# Patient Record
Sex: Female | Born: 1965
Health system: Southern US, Community
[De-identification: ages and names within clinical notes are randomized; demographics above are authoritative.]

## PROBLEM LIST (undated history)

## (undated) DIAGNOSIS — Z8489 Family history of other specified conditions: Secondary | ICD-10-CM

## (undated) DIAGNOSIS — K219 Gastro-esophageal reflux disease without esophagitis: Secondary | ICD-10-CM

## (undated) DIAGNOSIS — M1990 Unspecified osteoarthritis, unspecified site: Secondary | ICD-10-CM

---

## 1999-07-29 ENCOUNTER — Other Ambulatory Visit: Admission: RE | Admit: 1999-07-29 | Discharge: 1999-07-29 | Payer: Self-pay | Admitting: Obstetrics and Gynecology

## 2000-03-22 ENCOUNTER — Encounter: Payer: Self-pay | Admitting: Obstetrics & Gynecology

## 2000-03-22 ENCOUNTER — Encounter: Admission: RE | Admit: 2000-03-22 | Discharge: 2000-03-22 | Payer: Self-pay | Admitting: Obstetrics & Gynecology

## 2000-04-19 ENCOUNTER — Encounter: Payer: Self-pay | Admitting: Obstetrics & Gynecology

## 2000-04-19 ENCOUNTER — Encounter: Admission: RE | Admit: 2000-04-19 | Discharge: 2000-04-19 | Payer: Self-pay | Admitting: Obstetrics & Gynecology

## 2000-08-07 ENCOUNTER — Other Ambulatory Visit: Admission: RE | Admit: 2000-08-07 | Discharge: 2000-08-07 | Payer: Self-pay | Admitting: *Deleted

## 2000-09-15 ENCOUNTER — Ambulatory Visit (HOSPITAL_COMMUNITY): Admission: RE | Admit: 2000-09-15 | Discharge: 2000-09-15 | Payer: Self-pay | Admitting: Obstetrics & Gynecology

## 2000-09-15 ENCOUNTER — Encounter: Payer: Self-pay | Admitting: Obstetrics & Gynecology

## 2000-09-19 ENCOUNTER — Ambulatory Visit (HOSPITAL_COMMUNITY): Admission: RE | Admit: 2000-09-19 | Discharge: 2000-09-19 | Payer: Self-pay | Admitting: Obstetrics & Gynecology

## 2000-09-19 ENCOUNTER — Encounter: Payer: Self-pay | Admitting: Obstetrics & Gynecology

## 2001-08-22 ENCOUNTER — Other Ambulatory Visit: Admission: RE | Admit: 2001-08-22 | Discharge: 2001-08-22 | Payer: Self-pay | Admitting: Obstetrics & Gynecology

## 2002-08-08 ENCOUNTER — Other Ambulatory Visit: Admission: RE | Admit: 2002-08-08 | Discharge: 2002-08-08 | Payer: Self-pay | Admitting: Obstetrics & Gynecology

## 2003-03-09 ENCOUNTER — Inpatient Hospital Stay (HOSPITAL_COMMUNITY): Admission: AD | Admit: 2003-03-09 | Discharge: 2003-03-11 | Payer: Self-pay | Admitting: Obstetrics & Gynecology

## 2003-03-18 ENCOUNTER — Encounter: Admission: RE | Admit: 2003-03-18 | Discharge: 2003-04-17 | Payer: Self-pay | Admitting: Obstetrics & Gynecology

## 2003-04-18 ENCOUNTER — Other Ambulatory Visit: Admission: RE | Admit: 2003-04-18 | Discharge: 2003-04-18 | Payer: Self-pay | Admitting: Obstetrics & Gynecology

## 2003-04-18 ENCOUNTER — Encounter: Admission: RE | Admit: 2003-04-18 | Discharge: 2003-05-18 | Payer: Self-pay | Admitting: Obstetrics & Gynecology

## 2003-06-18 ENCOUNTER — Encounter: Admission: RE | Admit: 2003-06-18 | Discharge: 2003-07-18 | Payer: Self-pay | Admitting: Obstetrics & Gynecology

## 2003-07-19 ENCOUNTER — Encounter: Admission: RE | Admit: 2003-07-19 | Discharge: 2003-08-18 | Payer: Self-pay | Admitting: Obstetrics & Gynecology

## 2003-09-18 ENCOUNTER — Encounter: Admission: RE | Admit: 2003-09-18 | Discharge: 2003-10-18 | Payer: Self-pay | Admitting: Obstetrics & Gynecology

## 2004-04-29 ENCOUNTER — Encounter: Admission: RE | Admit: 2004-04-29 | Discharge: 2004-07-01 | Payer: Self-pay | Admitting: Neurosurgery

## 2004-07-26 ENCOUNTER — Encounter: Admission: RE | Admit: 2004-07-26 | Discharge: 2004-07-26 | Payer: Self-pay | Admitting: Occupational Medicine

## 2005-06-19 ENCOUNTER — Emergency Department (HOSPITAL_COMMUNITY): Admission: EM | Admit: 2005-06-19 | Discharge: 2005-06-19 | Payer: Self-pay | Admitting: Family Medicine

## 2005-07-08 ENCOUNTER — Ambulatory Visit (HOSPITAL_COMMUNITY): Admission: RE | Admit: 2005-07-08 | Discharge: 2005-07-08 | Payer: Self-pay | Admitting: Interventional Cardiology

## 2006-10-18 ENCOUNTER — Encounter (INDEPENDENT_AMBULATORY_CARE_PROVIDER_SITE_OTHER): Payer: Self-pay | Admitting: *Deleted

## 2006-10-18 ENCOUNTER — Ambulatory Visit (HOSPITAL_COMMUNITY): Admission: RE | Admit: 2006-10-18 | Discharge: 2006-10-18 | Payer: Self-pay | Admitting: Obstetrics & Gynecology

## 2007-03-13 ENCOUNTER — Encounter: Admission: RE | Admit: 2007-03-13 | Discharge: 2007-05-24 | Payer: Self-pay | Admitting: Orthopaedic Surgery

## 2007-08-30 ENCOUNTER — Ambulatory Visit (HOSPITAL_COMMUNITY): Admission: RE | Admit: 2007-08-30 | Discharge: 2007-08-30 | Payer: Self-pay | Admitting: Interventional Cardiology

## 2009-07-23 ENCOUNTER — Encounter: Admission: RE | Admit: 2009-07-23 | Discharge: 2009-07-23 | Payer: Self-pay | Admitting: Obstetrics & Gynecology

## 2010-08-05 ENCOUNTER — Encounter: Admission: RE | Admit: 2010-08-05 | Discharge: 2010-08-05 | Payer: Self-pay | Admitting: Obstetrics & Gynecology

## 2010-11-21 ENCOUNTER — Encounter: Payer: Self-pay | Admitting: Neurosurgery

## 2010-11-21 ENCOUNTER — Encounter: Payer: Self-pay | Admitting: Obstetrics & Gynecology

## 2011-03-18 NOTE — H&P (Signed)
NAMEAMARISA, [name]                           ACCOUNT NO.:  [id number]   MEDICAL RECORD NO.:  [id number]                   PATIENT TYPE:  INP   LOCATION:  9162                                 FACILITY:  [facility]   PHYSICIAN:  [name], M.D.             DATE OF BIRTH:  08/07/1966   DATE OF ADMISSION:  03/09/2003  DATE OF DISCHARGE:                                HISTORY & PHYSICAL   HISTORY OF PRESENT ILLNESS:  The patient is a 45 year old G1, expected date  of delivery Mar 18, 2003, at 38 weeks and five days' gestation.   REASON FOR ADMISSION:  Regular uterine contractions with fluid leak times  this morning.   HISTORY OF PRESENT ILLNESS:  Increasing uterine contractions q.64m. since  about 10 a.m. with mild amounts of clear amniotic fluid at that time.  Good  fetal movements.  No vaginal bleeding.  No PIH symptoms.   PAST MEDICAL HISTORY:  Mild arthritis, post track runner in high school.   PAST SURGICAL HISTORY:  Negative.   PAST GYNECOLOGIC HISTORY:  1. Positive for uterine myoma.  2. 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   ALLERGIES:  1. COMPAZINE.  2. FLEXERIL.  3. REGLAN, associated with severe edema.   MEDICATIONS:  1. Prenatal vitamins.  2. Unasyn h.s. p.r.n.  3. Darvocet-N 100 p.r.n. for fibroid pain.   FAMILY HISTORY:  Positive for cardiovascular disease.   HISTORY OF PRESENT PREGNANCY:  First trimester was normal.  The patient had  an ultrasound at 10+ weeks for larger than gestational age.  Dating  corresponded well.  The discrepancy was associated with uterine myoma.  One  was 6.1 x 5.8.  The other one was 11.2 x 8.2 cm with central necrosis.  The  patient used Unasyn h.s. and Vitamin B6 for nausea and vomiting in the first  trimester.  In the second trimester, an amniocentesis was done at 15 weeks  for advanced maternal age.  It showed normal chromosomes and __________  protein in the amniotic fluid.  An ultrasound review of  anatomy at 19+ weeks  was within normal limits.  The placenta was anterior previa.  Amniotic fluid  normal.  Cervix 5.7 cm and closed.  In the third trimester, one hour glucose  tolerance test was normal at 98.  A repeat ultrasound was done at 29 weeks,  showing normal placental insertion anteriorly.  The patient did have  progressive abdominal pains associated with anterior myoma, and that was  controlled with Darvocet p.r.n.  At 35+ weeks group B Strep was positive.  Her blood pressure has remained normal throughout the pregnancy and interval  growths with ultrasound were appropriate for gestational age.   REVIEW OF SYSTEMS:  CONSTITUTIONAL:  Negative.  HEENT:  Negative.  CARDIOVASCULAR/RESPIRATORY:  Negative.  GASTROINTESTINAL/UROLOGIC:  Negative.  ENDOCRINE/NEUROLOGIC/DERMATOLOGIC:  Negative.   PHYSICAL EXAMINATION:  GENERAL:  No apparent  distress except for pain with  uterine contractions.  VITAL SIGNS:  Temperature 98.6, blood pressure 126/63, pulse 54-60, regular,  respiratory rate 14.  LUNGS:  Clear bilaterally.  HEART:  Regular cardiac rhythm.  ABDOMEN:  Gravid, cephalic presentation.  Anterior fibroid is palpable,  slightly to the left inferiorly.  PELVIC:  Vaginal exam on admission, 3 cm dilated, 100% effaced, vertex -2  fixed, with tinted meconium amniotic fluid.  EXTREMITIES:  Lower limbs normal.  Monitoring fetal heart rate 135-140s with positive variability, occasional  mild variable decelerations.  Uterine contractions every 3-4 minutes, 70  seconds to 100 seconds.   IMPRESSION:  G1, 38 weeks and five days' gestation with spontaneous labor  and spontaneous rupture of membranes with meconium, fetal well-being  reassuring, group B Strep positive, anterior uterine myoma.   PLAN:  1. Admit to labor and delivery.  Expected management for probable vaginal     delivery.  2. Penicillin G for group B Strep positive.  3. Monitoring.  4. Will start amnio infusion if  meconium becomes moderate to fixed.                                               [name], M.D.    ML/MEDQ  D:  03/09/2003  T:  03/09/2003  Job:  [id number]

## 2011-03-18 NOTE — Op Note (Signed)
NAMEARANZA, [name]               ACCOUNT NO.:  [id number]   MEDICAL RECORD NO.:  [id number]          PATIENT TYPE:  AMB   LOCATION:  SDC                           FACILITY:  [facility]   PHYSICIAN:  Genia Del, M.D.DATE OF BIRTH:  Oct 13, 1966   DATE OF PROCEDURE:  10/18/2006  DATE OF DISCHARGE:                               OPERATIVE REPORT   PREOPERATIVE DIAGNOSES:  Nine plus weeks, non-desired, and severe  degenerative disk disease.   POSTOPERATIVE DIAGNOSES:  Nine plus weeks, non-desired, and severe  degenerative disk disease.   INTERVENTION:  Dilatation and curettage.   SURGEON:  Genia Del, M.D.   ASSISTANT:  None.   DESCRIPTION OF PROCEDURE:  Under general anesthesia with endotracheal  intubation, with the patient in the lithotomy position, she is prepped  with Betadine in the suprapubic, vulvar and vaginal areas.  The bladder  is catheterized and the patient is draped as usual.  The vaginal  examination reveals a retroverted uterus, corresponding to about [redacted]  weeks gestation.  The patient has myomas.  No adnexal mass.  The cervix  is long and closed.  No vaginal bleeding.  The speculum is introduced in  the vagina.  The anterior lip of the cervix is grasped with a tenaculum.  A paracervical block is done with lidocaine 1%, a total of 20 mL at 4  o'clock and 8 o'clock.  We then proceeded with dilatation of the cervix  with Heger dilators up to #33 easily.  A hysterometry was done at 11 cm  before the dilatation.  We then used a #10 curved suction curet to  suction the products of conception, and we did a systematic curettage of  the intrauterine cavity with a sharp curet.  The uterine sound is heard  throughout.  We then go back with a suction curet, to completely  evacuate the intrauterine cavity.  All instruments are then removed.  Hemostasis is adequate.  The estimated blood loss was 75 mL.   No complications occurred, and the patient was transferred to the  recovery room in good stable status.  Note that her blood group is Rh  positive.      Genia Del, M.D.  Electronically Signed     ML/MEDQ  D:  10/18/2006  T:  10/18/2006  Job:  [id number]

## 2011-08-09 ENCOUNTER — Other Ambulatory Visit: Payer: Self-pay | Admitting: Obstetrics & Gynecology

## 2011-08-09 DIAGNOSIS — Z1231 Encounter for screening mammogram for malignant neoplasm of breast: Secondary | ICD-10-CM

## 2011-08-11 ENCOUNTER — Ambulatory Visit
Admission: RE | Admit: 2011-08-11 | Discharge: 2011-08-11 | Disposition: A | Discharge status: Home / Self Care | Payer: 59 | Source: Ambulatory Visit | Attending: Obstetrics & Gynecology | Admitting: Obstetrics & Gynecology

## 2011-08-11 DIAGNOSIS — Z1231 Encounter for screening mammogram for malignant neoplasm of breast: Secondary | ICD-10-CM

## 2011-08-17 ENCOUNTER — Other Ambulatory Visit: Payer: Self-pay | Admitting: Obstetrics & Gynecology

## 2011-08-17 DIAGNOSIS — R928 Other abnormal and inconclusive findings on diagnostic imaging of breast: Secondary | ICD-10-CM

## 2011-08-31 ENCOUNTER — Ambulatory Visit
Admission: RE | Admit: 2011-08-31 | Discharge: 2011-08-31 | Disposition: A | Discharge status: Home / Self Care | Payer: 59 | Source: Ambulatory Visit | Attending: Obstetrics & Gynecology | Admitting: Obstetrics & Gynecology

## 2011-08-31 DIAGNOSIS — R928 Other abnormal and inconclusive findings on diagnostic imaging of breast: Secondary | ICD-10-CM

## 2012-07-14 ENCOUNTER — Emergency Department (HOSPITAL_COMMUNITY)
Admission: EM | Admit: 2012-07-14 | Discharge: 2012-07-14 | Disposition: A | Discharge status: Home / Self Care | Payer: 59 | Source: Home / Self Care | Attending: Emergency Medicine | Admitting: Emergency Medicine

## 2012-07-14 ENCOUNTER — Emergency Department (INDEPENDENT_AMBULATORY_CARE_PROVIDER_SITE_OTHER): Payer: 59

## 2012-07-14 ENCOUNTER — Encounter (HOSPITAL_COMMUNITY): Payer: Self-pay | Admitting: *Deleted

## 2012-07-14 DIAGNOSIS — S90129A Contusion of unspecified lesser toe(s) without damage to nail, initial encounter: Secondary | ICD-10-CM

## 2012-07-14 DIAGNOSIS — S9030XA Contusion of unspecified foot, initial encounter: Secondary | ICD-10-CM

## 2012-07-14 MED ORDER — MELOXICAM 7.5 MG PO TABS: 7.5000 mg | ORAL_TABLET | Freq: Every day | ORAL | Status: AC

## 2012-07-14 MED ORDER — HYDROCODONE-ACETAMINOPHEN 5-500 MG PO TABS: 1.0000 | ORAL_TABLET | Freq: Four times a day (QID) | ORAL | Status: AC | PRN

## 2012-07-14 NOTE — ED Provider Notes (Signed)
History     CSN: [id number]  Arrival date & time 07/14/12  1423   First MD Initiated Contact with Patient 07/14/12 1516      Chief Complaint  Patient presents with  . Foot Pain  . Toe Pain    (Consider location/radiation/quality/duration/timing/severity/associated sxs/prior treatment) HPI Comments: About 10 days ago patient stuffed the left third and fourth and fifth toes on the current of a furniture, it's been sore since then mainly on the fourth toe when she walks on her blood pressure. Patient attempted several things at home including buddy taping to her toes which she couldn't tolerate. Patient also denies any numbness, tingling sensations of her foot. Patient describes that she was waiting for the discomfort to go away but it doesn't seem to be improving at all as he decided to come in after 10 days of having sustained the initial injury.  Patient is a 46 y.o. female presenting with lower extremity pain and toe pain. The history is provided by the patient.  Foot Pain This is a new problem. The problem occurs constantly. The problem has been gradually worsening. She has tried nothing for the symptoms. The treatment provided no relief.  Toe Pain    History reviewed. No pertinent past medical history.  History reviewed. No pertinent past surgical history.  History reviewed. No pertinent family history.  History  Substance Use Topics  . Smoking status: Never Smoker   . Smokeless tobacco: Not on file  . Alcohol Use: Yes    OB History    Grav Para Term Preterm Abortions TAB SAB Ect Mult Living                  Review of Systems  Constitutional: Negative for chills, diaphoresis, activity change and appetite change.  Musculoskeletal: Positive for joint swelling.  Skin: Negative for color change, rash and wound.  Neurological: Negative for weakness and numbness.    Allergies  Compazine; Flexeril; and Reglan  Home Medications   Current Outpatient Rx  Name Route  Sig Dispense Refill  . HYDROCODONE-ACETAMINOPHEN 5-500 MG PO TABS Oral Take 1-2 tablets by mouth every 6 (six) hours as needed for pain. 15 tablet 0  . MELOXICAM 7.5 MG PO TABS Oral Take 1 tablet (7.5 mg total) by mouth daily. 14 tablet 0    BP 122/58  Pulse 56  Temp 98.3 F (36.8 C) (Oral)  Resp 18  SpO2 100%  Physical Exam  Nursing note and vitals reviewed. Constitutional: Vital signs are normal. She appears well-developed and well-nourished.  Non-toxic appearance. She does not have a sickly appearance. She does not appear ill. No distress.  Musculoskeletal: She exhibits tenderness.       Feet:  Neurological: She is alert.  Skin: Skin is warm.    ED Course  Procedures (including critical care time)  Labs Reviewed - No data to display Dg Foot Complete Right  07/14/2012  *RADIOLOGY REPORT*  Clinical Data: Injured right foot 10 days ago, persistent pain involving the third through fifth toes.  RIGHT FOOT COMPLETE - 3+ VIEW  Comparison: Right foot x-rays 07/26/2004.  Findings: No evidence of acute or subacute fracture or dislocation. Minimal joint space narrowing and associated hypertrophic spurring involving the first MTP joint.  Remaining joint spaces well preserved.  No other intrinsic osseous abnormalities.  IMPRESSION: No acute or subacute osseous abnormality.   Original Report Authenticated By: Arnell Sieving, M.D.      1. Contusion of foot including toes  MDM  Patient had normal x-rays. Was encouraged to take a Cox 2 inhibitor cycle for 10-14 days. Was advised to followup with the orthopedic Dr. if pain persisted beyond 10-14 days. Patient was also provided with a postop shoe.        Jimmie Molly, MD 07/14/12 1740

## 2012-07-14 NOTE — ED Notes (Signed)
Pt stubbed left 3rd 4th 5th toes x 10 days ago - per pt has been elevating and applying ice - continues to be painful

## 2012-08-10 ENCOUNTER — Other Ambulatory Visit: Payer: Self-pay | Admitting: Obstetrics & Gynecology

## 2012-08-10 DIAGNOSIS — Z1231 Encounter for screening mammogram for malignant neoplasm of breast: Secondary | ICD-10-CM

## 2012-09-05 ENCOUNTER — Ambulatory Visit
Admission: RE | Admit: 2012-09-05 | Discharge: 2012-09-05 | Disposition: A | Discharge status: Home / Self Care | Payer: 59 | Source: Ambulatory Visit | Attending: Obstetrics & Gynecology | Admitting: Obstetrics & Gynecology

## 2012-09-05 DIAGNOSIS — Z1231 Encounter for screening mammogram for malignant neoplasm of breast: Secondary | ICD-10-CM

## 2013-08-22 ENCOUNTER — Other Ambulatory Visit: Payer: Self-pay

## 2013-08-22 DIAGNOSIS — Z1231 Encounter for screening mammogram for malignant neoplasm of breast: Secondary | ICD-10-CM

## 2013-09-19 ENCOUNTER — Ambulatory Visit
Admission: RE | Admit: 2013-09-19 | Discharge: 2013-09-19 | Disposition: A | Discharge status: Home / Self Care | Payer: 59 | Source: Ambulatory Visit

## 2013-09-19 DIAGNOSIS — Z1231 Encounter for screening mammogram for malignant neoplasm of breast: Secondary | ICD-10-CM

## 2015-04-22 ENCOUNTER — Other Ambulatory Visit: Payer: Self-pay

## 2015-04-22 DIAGNOSIS — Z1231 Encounter for screening mammogram for malignant neoplasm of breast: Secondary | ICD-10-CM

## 2015-05-20 ENCOUNTER — Ambulatory Visit
Admission: RE | Admit: 2015-05-20 | Discharge: 2015-05-20 | Disposition: A | Discharge status: Home / Self Care | Payer: 59 | Source: Ambulatory Visit

## 2015-05-20 DIAGNOSIS — Z1231 Encounter for screening mammogram for malignant neoplasm of breast: Secondary | ICD-10-CM

## 2015-07-01 ENCOUNTER — Other Ambulatory Visit: Payer: Self-pay | Admitting: Obstetrics & Gynecology

## 2015-07-01 DIAGNOSIS — N6311 Unspecified lump in the right breast, upper outer quadrant: Secondary | ICD-10-CM

## 2015-07-08 ENCOUNTER — Ambulatory Visit
Admission: RE | Admit: 2015-07-08 | Discharge: 2015-07-08 | Disposition: A | Discharge status: Home / Self Care | Payer: 59 | Source: Ambulatory Visit | Attending: Obstetrics & Gynecology | Admitting: Obstetrics & Gynecology

## 2015-07-08 DIAGNOSIS — N6311 Unspecified lump in the right breast, upper outer quadrant: Secondary | ICD-10-CM

## 2015-11-04 DIAGNOSIS — R102 Pelvic and perineal pain: Secondary | ICD-10-CM | POA: Diagnosis not present

## 2016-01-07 MED FILL — IBUPROFEN 600 MG TABLET: 600 | 4 days supply | Qty: 14 | Fill #0

## 2016-02-03 DIAGNOSIS — H902 Conductive hearing loss, unspecified: Secondary | ICD-10-CM | POA: Diagnosis not present

## 2016-02-03 DIAGNOSIS — H6122 Impacted cerumen, left ear: Secondary | ICD-10-CM | POA: Diagnosis not present

## 2016-05-25 DIAGNOSIS — H5213 Myopia, bilateral: Secondary | ICD-10-CM | POA: Diagnosis not present

## 2016-05-25 DIAGNOSIS — H524 Presbyopia: Secondary | ICD-10-CM | POA: Diagnosis not present

## 2016-05-25 DIAGNOSIS — H04123 Dry eye syndrome of bilateral lacrimal glands: Secondary | ICD-10-CM | POA: Diagnosis not present

## 2016-05-25 DIAGNOSIS — H2513 Age-related nuclear cataract, bilateral: Secondary | ICD-10-CM | POA: Diagnosis not present

## 2016-06-22 DIAGNOSIS — Z13220 Encounter for screening for lipoid disorders: Secondary | ICD-10-CM | POA: Diagnosis not present

## 2016-06-22 DIAGNOSIS — Z79899 Other long term (current) drug therapy: Secondary | ICD-10-CM | POA: Diagnosis not present

## 2016-06-22 DIAGNOSIS — Z1329 Encounter for screening for other suspected endocrine disorder: Secondary | ICD-10-CM | POA: Diagnosis not present

## 2016-06-22 DIAGNOSIS — Z1211 Encounter for screening for malignant neoplasm of colon: Secondary | ICD-10-CM | POA: Diagnosis not present

## 2016-06-22 DIAGNOSIS — K219 Gastro-esophageal reflux disease without esophagitis: Secondary | ICD-10-CM | POA: Diagnosis not present

## 2016-06-22 DIAGNOSIS — Z131 Encounter for screening for diabetes mellitus: Secondary | ICD-10-CM | POA: Diagnosis not present

## 2016-06-22 DIAGNOSIS — M70852 Other soft tissue disorders related to use, overuse and pressure, left thigh: Secondary | ICD-10-CM | POA: Diagnosis not present

## 2016-06-23 MED FILL — OMEPRAZOLE DR 40 MG CAPSULE: 40 | 30 days supply | Qty: 30 | Fill #0

## 2016-06-24 ENCOUNTER — Other Ambulatory Visit: Payer: Self-pay | Admitting: Family

## 2016-06-24 DIAGNOSIS — K4090 Unilateral inguinal hernia, without obstruction or gangrene, not specified as recurrent: Secondary | ICD-10-CM

## 2016-06-29 ENCOUNTER — Ambulatory Visit
Admission: RE | Admit: 2016-06-29 | Discharge: 2016-06-29 | Disposition: A | Discharge status: Home / Self Care | Payer: 59 | Source: Ambulatory Visit | Attending: Family | Admitting: Family

## 2016-06-29 ENCOUNTER — Other Ambulatory Visit: Payer: Self-pay | Admitting: Obstetrics & Gynecology

## 2016-06-29 DIAGNOSIS — K4090 Unilateral inguinal hernia, without obstruction or gangrene, not specified as recurrent: Secondary | ICD-10-CM

## 2016-06-29 DIAGNOSIS — Z1231 Encounter for screening mammogram for malignant neoplasm of breast: Secondary | ICD-10-CM

## 2016-06-29 DIAGNOSIS — R1900 Intra-abdominal and pelvic swelling, mass and lump, unspecified site: Secondary | ICD-10-CM | POA: Diagnosis not present

## 2016-07-05 ENCOUNTER — Other Ambulatory Visit: Payer: Self-pay | Admitting: Family

## 2016-07-05 DIAGNOSIS — R1032 Left lower quadrant pain: Secondary | ICD-10-CM

## 2016-07-08 ENCOUNTER — Ambulatory Visit
Admission: RE | Admit: 2016-07-08 | Discharge: 2016-07-08 | Disposition: A | Discharge status: Home / Self Care | Payer: 59 | Source: Ambulatory Visit | Attending: Family | Admitting: Family

## 2016-07-08 DIAGNOSIS — R1032 Left lower quadrant pain: Secondary | ICD-10-CM | POA: Diagnosis not present

## 2016-07-13 ENCOUNTER — Ambulatory Visit
Admission: RE | Admit: 2016-07-13 | Discharge: 2016-07-13 | Disposition: A | Discharge status: Home / Self Care | Payer: 59 | Source: Ambulatory Visit | Attending: Obstetrics & Gynecology | Admitting: Obstetrics & Gynecology

## 2016-07-13 DIAGNOSIS — Z1231 Encounter for screening mammogram for malignant neoplasm of breast: Secondary | ICD-10-CM

## 2016-08-02 MED FILL — OMEPRAZOLE DR 40 MG CAPSULE: 40 | 30 days supply | Qty: 30 | Fill #1

## 2016-08-03 DIAGNOSIS — D171 Benign lipomatous neoplasm of skin and subcutaneous tissue of trunk: Secondary | ICD-10-CM | POA: Diagnosis not present

## 2016-08-04 ENCOUNTER — Ambulatory Visit: Payer: Self-pay | Admitting: Surgery

## 2016-08-04 NOTE — H&P (Signed)
History of Present Illness [name]. Arturo Sofranko MD; 08/04/2016 12:12 PM) The patient is a 50 year old female who presents with a complaint of Mass. 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 She presents with a 10 year history of a small "quarter-sized" mass just anterior to her left hip. This has gradually enlarged. About 1 month ago this began to cause fairly significant pain that radiates down her leg and across her pelvis. This is causing some difficulty with her mobility because of the pain. She underwent a CT scan of the abdomen and pelvis that showed only a 4.2 cm subcutaneous mass in the area of greatest tenderness. This is consistent with a subcutaneous lipoma. This area has never become infected.  CLINICAL DATA: Prominence in the left lower lateral abdominal wall. , initial encounter  EXAM: CT ABDOMEN AND PELVIS WITHOUT CONTRAST  TECHNIQUE: Multidetector CT imaging of the abdomen and pelvis was performed following the standard protocol without IV contrast.  COMPARISON: None.  FINDINGS: Lower chest: Lung bases are free of acute infiltrate or sizable effusion.  Hepatobiliary: Within normal limits.  Pancreas: Within normal limits.  Spleen: Within normal limits.  Adrenals/Urinary Tract: No renal calculi or obstructive changes are seen. The adrenal glands are within normal limits. The bladder is decompressed.  Stomach/Bowel: Fecal material is noted throughout the colon. The appendix is within normal limits. No diverticular change is seen. No obstructive changes are noted.  Vascular/Lymphatic: No focal abnormality is identified.  Reproductive: An IUD is noted within the uterus. Calcified uterine fibroid is noted on the left.  Musculoskeletal: Degenerative changes of the lumbar spine are noted.  Other: In the area of clinical concern there is a focal 4.2 x 2.4 x 3.4 cm focal collection of  subcutaneous fat consistent with a lipoma.  IMPRESSION: Subcutaneous lipoma which corresponds with the patient's clinical abnormality.  No other acute abnormality is seen.   Electronically Signed By: Inez Catalina M.D. On: 07/08/2016 09:58   Other Problems Elbert Ewings, CMA; 08/03/2016 2:30 PM) Arthritis Back Pain Gastroesophageal Reflux Disease  Past Surgical History Elbert Ewings, CMA; 08/03/2016 2:30 PM) No pertinent past surgical history  Diagnostic Studies History Elbert Ewings, CMA; 08/03/2016 2:30 PM) Colonoscopy never Mammogram within last year Pap Smear 1-5 years ago  Allergies Elbert Ewings, CMA; 08/03/2016 2:31 PM) Compazine *ANTIPSYCHOTICS/ANTIMANIC AGENTS* Flexeril *MUSCULOSKELETAL THERAPY AGENTS* Reglan *GASTROINTESTINAL AGENTS - MISC.*  Medication History Elbert Ewings, CMA; 08/03/2016 2:31 PM) Protonix (40MG  Tablet DR, Oral) Active. Medications Reconciled  Social History Elbert Ewings, [place]; 08/03/2016 2:30 PM) Alcohol use Occasional alcohol use. Caffeine use Tea. No drug use Tobacco use Never smoker.  Family History Elbert Ewings, [place]; 08/03/2016 2:30 PM) Anesthetic complications Mother. Arthritis Family Members In General. Breast Cancer Mother. Cancer Father. Cerebrovascular Accident Family Members In General. Depression Mother. Diabetes Mellitus Family Members In General. Heart Disease Father. Hypertension Family Members In General, Mother. Kidney Disease Family Members In General. Respiratory Condition Family Members In General, Father. Thyroid problems Sister.  Pregnancy / Birth History Elbert Ewings, [place]; 08/03/2016 2:30 PM) Age at menarche 68 years. Contraceptive History Intrauterine device. Gravida 2 Irregular periods Length (months) of breastfeeding 3-6 Maternal age 19-40 Para 1     Review of Systems Elbert Ewings CMA; 08/03/2016 2:30 PM) General Not Present- Appetite Loss, Chills, Fatigue, Fever, Night  Sweats, Weight Gain and Weight Loss. Skin Not Present- Change in Wart/Mole, Dryness, Hives, Jaundice, New  Lesions, Non-Healing Wounds, Rash and Ulcer. HEENT Present- Wears glasses/contact lenses. Not Present- Earache, Hearing Loss, Hoarseness, Nose Bleed, Oral Ulcers, Ringing in the Ears, Seasonal Allergies, Sinus Pain, Sore Throat, Visual Disturbances and Yellow Eyes. Respiratory Not Present- Bloody sputum, Chronic Cough, Difficulty Breathing, Snoring and Wheezing. Breast Not Present- Breast Mass, Breast Pain, Nipple Discharge and Skin Changes. Cardiovascular Not Present- Chest Pain, Difficulty Breathing Lying Down, Leg Cramps, Palpitations, Rapid Heart Rate, Shortness of Breath and Swelling of Extremities. Gastrointestinal Present- Abdominal Pain and Excessive gas. Not Present- Bloating, Bloody Stool, Change in Bowel Habits, Chronic diarrhea, Constipation, Difficulty Swallowing, Gets full quickly at meals, Hemorrhoids, Indigestion, Nausea, Rectal Pain and Vomiting. Female Genitourinary Present- Pelvic Pain. Not Present- Frequency, Nocturia, Painful Urination and Urgency. Musculoskeletal Present- Back Pain and Joint Stiffness. Not Present- Joint Pain, Muscle Pain, Muscle Weakness and Swelling of Extremities. Neurological Not Present- Decreased Memory, Fainting, Headaches, Numbness, Seizures, Tingling, Tremor, Trouble walking and Weakness. Psychiatric Not Present- Anxiety, Bipolar, Change in Sleep Pattern, Depression, Fearful and Frequent crying. Endocrine Not Present- Cold Intolerance, Excessive Hunger, Hair Changes, Heat Intolerance, Hot flashes and New Diabetes. Hematology Not Present- Blood Thinners, Easy Bruising, Excessive bleeding, Gland problems, HIV and Persistent Infections.  Vitals Elbert Ewings CMA; 08/03/2016 2:31 PM) 08/03/2016 2:31 PM Weight: 170 lb Height: 69in Body Surface Area: 1.93 m Body Mass Index: 25.1 kg/m  Temp.: 98.21F(Temporal)  Pulse: 72 (Regular)  BP: 126/78  (Sitting, Left Arm, Standard)      Physical Exam Rodman Key K. Kamaury Cutbirth MD; 08/04/2016 12:12 PM)  The physical exam findings are as follows: Note:WDWN in NAD Eyes: Pupils equal, round; sclera anicteric HENT: Oral mucosa moist; good dentition Neck: No masses palpated, no thyromegaly Lungs: CTA bilaterally; normal respiratory effort CV: Regular rate and rhythm; no murmurs; extremities well-perfused with no edema Abd: +bowel sounds, soft, non-tender, no palpable organomegaly; no palpable hernias; left hip just anterior to ASIS - 4 cm protruding subcutaneous mass; tender with compression/ palpation; no sign of inflammation Skin: Warm, dry; no sign of jaundice Psychiatric - alert and oriented x 4; calm mood and affect    Assessment & Plan Rodman Key K. 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 The surgical procedure has been discussed with the patient. Potential risks, benefits, alternative treatments, and expected outcomes have been explained. All of the patient's questions at this time have been answered. The likelihood of reaching the patient's treatment goal is good. The patient understand the proposed surgical procedure and wishes to proceed. Note:The patient's symptoms seem to be radiating from this point. I told her that it was relatively unusual to have such severe pain coming from a lipoma. It is possible that the lipomas compressing the ilioinguinal nerve. We will plan to excise this and see if her symptoms resolve.  [name]. Georgette Dover, MD, [facility] Surgery  General/ Trauma Surgery  08/04/2016 12:14 PM

## 2016-08-10 ENCOUNTER — Encounter (HOSPITAL_BASED_OUTPATIENT_CLINIC_OR_DEPARTMENT_OTHER): Payer: Self-pay | Admitting: *Deleted

## 2016-08-17 ENCOUNTER — Ambulatory Visit (HOSPITAL_BASED_OUTPATIENT_CLINIC_OR_DEPARTMENT_OTHER): Payer: 59 | Admitting: Certified Registered"

## 2016-08-17 ENCOUNTER — Encounter (HOSPITAL_BASED_OUTPATIENT_CLINIC_OR_DEPARTMENT_OTHER): Payer: Self-pay | Admitting: *Deleted

## 2016-08-17 ENCOUNTER — Encounter (HOSPITAL_BASED_OUTPATIENT_CLINIC_OR_DEPARTMENT_OTHER)
Admission: RE | Disposition: A | Discharge status: Home / Self Care | Payer: Self-pay | Source: Ambulatory Visit | Attending: Surgery

## 2016-08-17 ENCOUNTER — Ambulatory Visit (HOSPITAL_BASED_OUTPATIENT_CLINIC_OR_DEPARTMENT_OTHER)
Admission: RE | Admit: 2016-08-17 | Discharge: 2016-08-17 | Disposition: A | Discharge status: Home / Self Care | Payer: 59 | Source: Ambulatory Visit | Attending: Surgery | Admitting: Surgery

## 2016-08-17 DIAGNOSIS — D1724 Benign lipomatous neoplasm of skin and subcutaneous tissue of left leg: Secondary | ICD-10-CM | POA: Diagnosis not present

## 2016-08-17 DIAGNOSIS — D1739 Benign lipomatous neoplasm of skin and subcutaneous tissue of other sites: Secondary | ICD-10-CM | POA: Insufficient documentation

## 2016-08-17 DIAGNOSIS — Z7951 Long term (current) use of inhaled steroids: Secondary | ICD-10-CM | POA: Insufficient documentation

## 2016-08-17 DIAGNOSIS — K219 Gastro-esophageal reflux disease without esophagitis: Secondary | ICD-10-CM | POA: Insufficient documentation

## 2016-08-17 DIAGNOSIS — Z79899 Other long term (current) drug therapy: Secondary | ICD-10-CM | POA: Insufficient documentation

## 2016-08-17 DIAGNOSIS — D171 Benign lipomatous neoplasm of skin and subcutaneous tissue of trunk: Secondary | ICD-10-CM | POA: Diagnosis not present

## 2016-08-17 HISTORY — DX: Unspecified osteoarthritis, unspecified site: M19.90

## 2016-08-17 HISTORY — DX: Family history of other specified conditions: Z84.89

## 2016-08-17 HISTORY — PX: LIPOMA EXCISION: SHX5283

## 2016-08-17 HISTORY — DX: Gastro-esophageal reflux disease without esophagitis: K21.9

## 2016-08-17 SURGERY — EXCISION LIPOMA
Anesthesia: General | Site: Hip | Laterality: Left

## 2016-08-17 MED ORDER — HYDROMORPHONE HCL 1 MG/ML IJ SOLN
0.2500 mg | INTRAMUSCULAR | Status: DC | PRN
  Administered 2016-08-17: 0.5 mg via INTRAVENOUS

## 2016-08-17 MED ORDER — OXYCODONE HCL 5 MG/5ML PO SOLN: 5.0000 mg | Freq: Once | ORAL | Status: DC | PRN

## 2016-08-17 MED ORDER — CHLORHEXIDINE GLUCONATE CLOTH 2 % EX PADS: 6.0000 | MEDICATED_PAD | Freq: Once | CUTANEOUS | Status: DC

## 2016-08-17 MED ORDER — FENTANYL CITRATE (PF) 100 MCG/2ML IJ SOLN
INTRAMUSCULAR | Status: DC | PRN
  Administered 2016-08-17: 100 ug via INTRAVENOUS

## 2016-08-17 MED ORDER — HYDROCODONE-ACETAMINOPHEN 5-325 MG PO TABS: 1.0000 | ORAL_TABLET | Freq: Four times a day (QID) | ORAL | 0 refills | Status: DC | PRN

## 2016-08-17 MED ORDER — ONDANSETRON HCL 4 MG/2ML IJ SOLN
INTRAMUSCULAR | Status: AC
  Filled 2016-08-17: qty 2

## 2016-08-17 MED ORDER — PROPOFOL 500 MG/50ML IV EMUL
INTRAVENOUS | Status: AC
Start: 2016-08-17 — End: 2016-08-17
  Filled 2016-08-17: qty 50

## 2016-08-17 MED ORDER — DEXAMETHASONE SODIUM PHOSPHATE 10 MG/ML IJ SOLN
INTRAMUSCULAR | Status: AC
  Filled 2016-08-17: qty 1

## 2016-08-17 MED ORDER — GLYCOPYRROLATE 0.2 MG/ML IJ SOLN: 0.2000 mg | Freq: Once | INTRAMUSCULAR | Status: DC | PRN

## 2016-08-17 MED ORDER — LIDOCAINE 2% (20 MG/ML) 5 ML SYRINGE
INTRAMUSCULAR | Status: AC
  Filled 2016-08-17: qty 5

## 2016-08-17 MED ORDER — PROPOFOL 10 MG/ML IV BOLUS
INTRAVENOUS | Status: DC | PRN
  Administered 2016-08-17: 200 mg via INTRAVENOUS

## 2016-08-17 MED ORDER — SUCCINYLCHOLINE CHLORIDE 200 MG/10ML IV SOSY
PREFILLED_SYRINGE | INTRAVENOUS | Status: AC
  Filled 2016-08-17: qty 10

## 2016-08-17 MED ORDER — DEXAMETHASONE SODIUM PHOSPHATE 4 MG/ML IJ SOLN
INTRAMUSCULAR | Status: DC | PRN
  Administered 2016-08-17: 10 mg via INTRAVENOUS

## 2016-08-17 MED ORDER — ONDANSETRON HCL 4 MG/2ML IJ SOLN
INTRAMUSCULAR | Status: DC | PRN
  Administered 2016-08-17: 4 mg via INTRAVENOUS

## 2016-08-17 MED ORDER — SCOPOLAMINE 1 MG/3DAYS TD PT72: 1.0000 | MEDICATED_PATCH | Freq: Once | TRANSDERMAL | Status: DC | PRN

## 2016-08-17 MED ORDER — MIDAZOLAM HCL 5 MG/5ML IJ SOLN
INTRAMUSCULAR | Status: DC | PRN
  Administered 2016-08-17: 2 mg via INTRAVENOUS

## 2016-08-17 MED ORDER — BUPIVACAINE-EPINEPHRINE 0.25% -1:200000 IJ SOLN
INTRAMUSCULAR | Status: DC | PRN
  Administered 2016-08-17: 20 mL

## 2016-08-17 MED ORDER — HYDROMORPHONE HCL 1 MG/ML IJ SOLN
INTRAMUSCULAR | Status: AC
  Filled 2016-08-17: qty 1

## 2016-08-17 MED ORDER — CEFAZOLIN SODIUM-DEXTROSE 2-4 GM/100ML-% IV SOLN
INTRAVENOUS | Status: AC
  Filled 2016-08-17: qty 100

## 2016-08-17 MED ORDER — ATROPINE SULFATE 0.4 MG/ML IJ SOLN
INTRAMUSCULAR | Status: AC
Start: 2016-08-17 — End: 2016-08-17
  Filled 2016-08-17: qty 1

## 2016-08-17 MED ORDER — FENTANYL CITRATE (PF) 100 MCG/2ML IJ SOLN
INTRAMUSCULAR | Status: AC
  Filled 2016-08-17: qty 2

## 2016-08-17 MED ORDER — CEFAZOLIN SODIUM-DEXTROSE 2-4 GM/100ML-% IV SOLN
2.0000 g | INTRAVENOUS | Status: AC
  Administered 2016-08-17: 2 g via INTRAVENOUS

## 2016-08-17 MED ORDER — BUPIVACAINE-EPINEPHRINE (PF) 0.25% -1:200000 IJ SOLN
INTRAMUSCULAR | Status: AC
  Filled 2016-08-17: qty 30

## 2016-08-17 MED ORDER — LACTATED RINGERS IV SOLN: INTRAVENOUS | Status: DC

## 2016-08-17 MED ORDER — MIDAZOLAM HCL 2 MG/2ML IJ SOLN
INTRAMUSCULAR | Status: AC
  Filled 2016-08-17: qty 2

## 2016-08-17 MED ORDER — MEPERIDINE HCL 25 MG/ML IJ SOLN: 6.2500 mg | INTRAMUSCULAR | Status: DC | PRN

## 2016-08-17 MED ORDER — OXYCODONE HCL 5 MG PO TABS: 5.0000 mg | ORAL_TABLET | Freq: Once | ORAL | Status: DC | PRN

## 2016-08-17 MED ORDER — LIDOCAINE 2% (20 MG/ML) 5 ML SYRINGE
INTRAMUSCULAR | Status: DC | PRN
  Administered 2016-08-17: 60 mg via INTRAVENOUS

## 2016-08-17 MED ORDER — LACTATED RINGERS IV SOLN
INTRAVENOUS | Status: DC
  Administered 2016-08-17: 08:00:00 via INTRAVENOUS

## 2016-08-17 MED FILL — HYDROCODON-APAP 5-325: 5-325 | 4 days supply | Qty: 30 | Fill #0

## 2016-08-17 SURGICAL SUPPLY — 43 items
APL SKNCLS STERI-STRIP NONHPOA (GAUZE/BANDAGES/DRESSINGS) ×1
BENZOIN TINCTURE PRP APPL 2/3 (GAUZE/BANDAGES/DRESSINGS) ×2 IMPLANT
BLADE CLIPPER SURG (BLADE) IMPLANT
BLADE SURG 15 STRL LF DISP TIS (BLADE) ×1 IMPLANT
BLADE SURG 15 STRL SS (BLADE) ×2
CANISTER SUCT 1200ML W/VALVE (MISCELLANEOUS) IMPLANT
CHLORAPREP W/TINT 26ML (MISCELLANEOUS) ×2 IMPLANT
COVER BACK TABLE 60X90IN (DRAPES) ×2 IMPLANT
COVER MAYO STAND STRL (DRAPES) ×2 IMPLANT
DECANTER SPIKE VIAL GLASS SM (MISCELLANEOUS) IMPLANT
DRAPE LAPAROTOMY 100X72 PEDS (DRAPES) ×2 IMPLANT
DRAPE UTILITY XL STRL (DRAPES) ×2 IMPLANT
DRSG TEGADERM 4X4.75 (GAUZE/BANDAGES/DRESSINGS) ×2 IMPLANT
ELECT COATED BLADE 2.86 ST (ELECTRODE) ×2 IMPLANT
ELECT REM PT RETURN 9FT ADLT (ELECTROSURGICAL) ×2
ELECTRODE REM PT RTRN 9FT ADLT (ELECTROSURGICAL) ×1 IMPLANT
GLOVE BIO SURGEON STRL SZ7 (GLOVE) ×2 IMPLANT
GLOVE BIOGEL PI IND STRL 7.0 (GLOVE) IMPLANT
GLOVE BIOGEL PI IND STRL 7.5 (GLOVE) ×1 IMPLANT
GLOVE BIOGEL PI INDICATOR 7.0 (GLOVE) ×1
GLOVE BIOGEL PI INDICATOR 7.5 (GLOVE) ×1
GLOVE ECLIPSE 6.5 STRL STRAW (GLOVE) ×1 IMPLANT
GOWN STRL REUS W/ TWL LRG LVL3 (GOWN DISPOSABLE) ×2 IMPLANT
GOWN STRL REUS W/TWL LRG LVL3 (GOWN DISPOSABLE) ×4
NDL HYPO 25X1 1.5 SAFETY (NEEDLE) ×1 IMPLANT
NEEDLE HYPO 25X1 1.5 SAFETY (NEEDLE) ×2 IMPLANT
NS IRRIG 1000ML POUR BTL (IV SOLUTION) IMPLANT
PACK BASIN DAY SURGERY FS (CUSTOM PROCEDURE TRAY) ×2 IMPLANT
PENCIL BUTTON HOLSTER BLD 10FT (ELECTRODE) ×2 IMPLANT
SPONGE GAUZE 2X2 8PLY STRL LF (GAUZE/BANDAGES/DRESSINGS) IMPLANT
SPONGE GAUZE 4X4 12PLY STER LF (GAUZE/BANDAGES/DRESSINGS) IMPLANT
STRIP CLOSURE SKIN 1/2X4 (GAUZE/BANDAGES/DRESSINGS) ×2 IMPLANT
SUT MON AB 4-0 PC3 18 (SUTURE) ×1 IMPLANT
SUT PROLENE 6 0 P 1 18 (SUTURE) IMPLANT
SUT SILK 2 0 FS (SUTURE) IMPLANT
SUT VIC AB 3-0 SH 27 (SUTURE) ×2
SUT VIC AB 3-0 SH 27X BRD (SUTURE) IMPLANT
SUT VICRYL 3-0 CR8 SH (SUTURE) IMPLANT
SYR CONTROL 10ML LL (SYRINGE) ×2 IMPLANT
TOWEL OR 17X24 6PK STRL BLUE (TOWEL DISPOSABLE) ×2 IMPLANT
TOWEL OR NON WOVEN STRL DISP B (DISPOSABLE) ×2 IMPLANT
TUBE CONNECTING 20X1/4 (TUBING) IMPLANT
YANKAUER SUCT BULB TIP NO VENT (SUCTIONS) IMPLANT

## 2016-08-17 NOTE — H&P (View-Only) (Signed)
History of Present Illness [name]. Keani Gotcher MD; 08/04/2016 12:12 PM) The patient is a 50 year old female who presents with a complaint of Mass. 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 She presents with a 10 year history of a small "quarter-sized" mass just anterior to her left hip. This has gradually enlarged. About 1 month ago this began to cause fairly significant pain that radiates down her leg and across her pelvis. This is causing some difficulty with her mobility because of the pain. She underwent a CT scan of the abdomen and pelvis that showed only a 4.2 cm subcutaneous mass in the area of greatest tenderness. This is consistent with a subcutaneous lipoma. This area has never become infected.  CLINICAL DATA: Prominence in the left lower lateral abdominal wall. , initial encounter  EXAM: CT ABDOMEN AND PELVIS WITHOUT CONTRAST  TECHNIQUE: Multidetector CT imaging of the abdomen and pelvis was performed following the standard protocol without IV contrast.  COMPARISON: None.  FINDINGS: Lower chest: Lung bases are free of acute infiltrate or sizable effusion.  Hepatobiliary: Within normal limits.  Pancreas: Within normal limits.  Spleen: Within normal limits.  Adrenals/Urinary Tract: No renal calculi or obstructive changes are seen. The adrenal glands are within normal limits. The bladder is decompressed.  Stomach/Bowel: Fecal material is noted throughout the colon. The appendix is within normal limits. No diverticular change is seen. No obstructive changes are noted.  Vascular/Lymphatic: No focal abnormality is identified.  Reproductive: An IUD is noted within the uterus. Calcified uterine fibroid is noted on the left.  Musculoskeletal: Degenerative changes of the lumbar spine are noted.  Other: In the area of clinical concern there is a focal 4.2 x 2.4 x 3.4 cm focal collection of  subcutaneous fat consistent with a lipoma.  IMPRESSION: Subcutaneous lipoma which corresponds with the patient's clinical abnormality.  No other acute abnormality is seen.   Electronically Signed By: Inez Catalina M.D. On: 07/08/2016 09:58   Other Problems Elbert Ewings, CMA; 08/03/2016 2:30 PM) Arthritis Back Pain Gastroesophageal Reflux Disease  Past Surgical History Elbert Ewings, CMA; 08/03/2016 2:30 PM) No pertinent past surgical history  Diagnostic Studies History Elbert Ewings, CMA; 08/03/2016 2:30 PM) Colonoscopy never Mammogram within last year Pap Smear 1-5 years ago  Allergies Elbert Ewings, CMA; 08/03/2016 2:31 PM) Compazine *ANTIPSYCHOTICS/ANTIMANIC AGENTS* Flexeril *MUSCULOSKELETAL THERAPY AGENTS* Reglan *GASTROINTESTINAL AGENTS - MISC.*  Medication History Elbert Ewings, CMA; 08/03/2016 2:31 PM) Protonix (40MG  Tablet DR, Oral) Active. Medications Reconciled  Social History Elbert Ewings, [place]; 08/03/2016 2:30 PM) Alcohol use Occasional alcohol use. Caffeine use Tea. No drug use Tobacco use Never smoker.  Family History Elbert Ewings, [place]; 08/03/2016 2:30 PM) Anesthetic complications Mother. Arthritis Family Members In General. Breast Cancer Mother. Cancer Father. Cerebrovascular Accident Family Members In General. Depression Mother. Diabetes Mellitus Family Members In General. Heart Disease Father. Hypertension Family Members In General, Mother. Kidney Disease Family Members In General. Respiratory Condition Family Members In General, Father. Thyroid problems Sister.  Pregnancy / Birth History Elbert Ewings, [place]; 08/03/2016 2:30 PM) Age at menarche 66 years. Contraceptive History Intrauterine device. Gravida 2 Irregular periods Length (months) of breastfeeding 3-6 Maternal age 33-40 Para 1     Review of Systems Elbert Ewings CMA; 08/03/2016 2:30 PM) General Not Present- Appetite Loss, Chills, Fatigue, Fever, Night  Sweats, Weight Gain and Weight Loss. Skin Not Present- Change in Wart/Mole, Dryness, Hives, Jaundice, New  Lesions, Non-Healing Wounds, Rash and Ulcer. HEENT Present- Wears glasses/contact lenses. Not Present- Earache, Hearing Loss, Hoarseness, Nose Bleed, Oral Ulcers, Ringing in the Ears, Seasonal Allergies, Sinus Pain, Sore Throat, Visual Disturbances and Yellow Eyes. Respiratory Not Present- Bloody sputum, Chronic Cough, Difficulty Breathing, Snoring and Wheezing. Breast Not Present- Breast Mass, Breast Pain, Nipple Discharge and Skin Changes. Cardiovascular Not Present- Chest Pain, Difficulty Breathing Lying Down, Leg Cramps, Palpitations, Rapid Heart Rate, Shortness of Breath and Swelling of Extremities. Gastrointestinal Present- Abdominal Pain and Excessive gas. Not Present- Bloating, Bloody Stool, Change in Bowel Habits, Chronic diarrhea, Constipation, Difficulty Swallowing, Gets full quickly at meals, Hemorrhoids, Indigestion, Nausea, Rectal Pain and Vomiting. Female Genitourinary Present- Pelvic Pain. Not Present- Frequency, Nocturia, Painful Urination and Urgency. Musculoskeletal Present- Back Pain and Joint Stiffness. Not Present- Joint Pain, Muscle Pain, Muscle Weakness and Swelling of Extremities. Neurological Not Present- Decreased Memory, Fainting, Headaches, Numbness, Seizures, Tingling, Tremor, Trouble walking and Weakness. Psychiatric Not Present- Anxiety, Bipolar, Change in Sleep Pattern, Depression, Fearful and Frequent crying. Endocrine Not Present- Cold Intolerance, Excessive Hunger, Hair Changes, Heat Intolerance, Hot flashes and New Diabetes. Hematology Not Present- Blood Thinners, Easy Bruising, Excessive bleeding, Gland problems, HIV and Persistent Infections.  Vitals Elbert Ewings CMA; 08/03/2016 2:31 PM) 08/03/2016 2:31 PM Weight: 170 lb Height: 69in Body Surface Area: 1.93 m Body Mass Index: 25.1 kg/m  Temp.: 98.19F(Temporal)  Pulse: 72 (Regular)  BP: 126/78  (Sitting, Left Arm, Standard)      Physical Exam Rodman Key K. Chaz Ronning MD; 08/04/2016 12:12 PM)  The physical exam findings are as follows: Note:WDWN in NAD Eyes: Pupils equal, round; sclera anicteric HENT: Oral mucosa moist; good dentition Neck: No masses palpated, no thyromegaly Lungs: CTA bilaterally; normal respiratory effort CV: Regular rate and rhythm; no murmurs; extremities well-perfused with no edema Abd: +bowel sounds, soft, non-tender, no palpable organomegaly; no palpable hernias; left hip just anterior to ASIS - 4 cm protruding subcutaneous mass; tender with compression/ palpation; no sign of inflammation Skin: Warm, dry; no sign of jaundice Psychiatric - alert and oriented x 4; calm mood and affect    Assessment & Plan Rodman Key K. 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 The surgical procedure has been discussed with the patient. Potential risks, benefits, alternative treatments, and expected outcomes have been explained. All of the patient's questions at this time have been answered. The likelihood of reaching the patient's treatment goal is good. The patient understand the proposed surgical procedure and wishes to proceed. Note:The patient's symptoms seem to be radiating from this point. I told her that it was relatively unusual to have such severe pain coming from a lipoma. It is possible that the lipomas compressing the ilioinguinal nerve. We will plan to excise this and see if her symptoms resolve.  [name]. Georgette Dover, MD, [facility] Surgery  General/ Trauma Surgery  08/04/2016 12:14 PM

## 2016-08-17 NOTE — Anesthesia Preprocedure Evaluation (Addendum)
Anesthesia Evaluation  Patient identified by MRN, date of birth, ID band Patient awake    Reviewed: Allergy & Precautions, NPO status , Patient's Chart, lab work & pertinent test results  Airway Mallampati: II  TM Distance: >3 FB Neck ROM: Full    Dental  (+) Teeth Intact, Dental Advisory Given   Pulmonary neg pulmonary ROS,    breath sounds clear to auscultation       Cardiovascular negative cardio ROS   Rhythm:Regular Rate:Normal     Neuro/Psych negative neurological ROS  negative psych ROS   GI/Hepatic Neg liver ROS, GERD  Medicated,  Endo/Other  negative endocrine ROS  Renal/GU negative Renal ROS  negative genitourinary   Musculoskeletal  (+) Arthritis , Osteoarthritis,    Abdominal   Peds negative pediatric ROS (+)  Hematology negative hematology ROS (+)   Anesthesia Other Findings   Reproductive/Obstetrics negative OB ROS                            Anesthesia Physical Anesthesia Plan  ASA: II  Anesthesia Plan: General   Post-op Pain Management:    Induction: Intravenous  Airway Management Planned: LMA  Additional Equipment:   Intra-op Plan:   Post-operative Plan: Extubation in OR  Informed Consent: I have reviewed the patients History and Physical, chart, labs and discussed the procedure including the risks, benefits and alternatives for the proposed anesthesia with the patient or authorized representative who has indicated his/her understanding and acceptance.   Dental advisory given  Plan Discussed with: CRNA  Anesthesia Plan Comments:         Anesthesia Quick Evaluation

## 2016-08-17 NOTE — Anesthesia Postprocedure Evaluation (Signed)
Anesthesia Post Note  Patient: [name]  Procedure(s) Performed: Procedure(s) (LRB): EXCISION OF SUBCUTANEOUS LIPOMA LEFT HIP (Left)  Patient location during evaluation: PACU Anesthesia Type: General Level of consciousness: awake and alert Pain management: pain level controlled Vital Signs Assessment: post-procedure vital signs reviewed and stable Respiratory status: spontaneous breathing, nonlabored ventilation, respiratory function stable and patient connected to nasal cannula oxygen Cardiovascular status: blood pressure returned to baseline and stable Postop Assessment: no signs of nausea or vomiting Anesthetic complications: no    Last Vitals:  Vitals:   08/17/16 1015 08/17/16 1032  BP: (!) 145/72 (!) 143/63  Pulse: (!) 51 (!) 43  Resp: 16 16  Temp:  36.6 C    Last Pain:  Vitals:   08/17/16 1032  TempSrc: Oral  PainSc: 2                  Effie Berkshire

## 2016-08-17 NOTE — Discharge Instructions (Signed)
You may apply an ice pack to this region to help with discomfort You may use the pain medicine as needed. Try to transition to ibuprofen or Tylenol soonest possible Remove the outer dressing on Friday. He will see some Steri-Strips that are glued to the skin. You may shower over this area but do not scrub over the Steri-Strips. The Steri-Strips will come off in 1-2 weeks. The sutures beneath the skin will dissolve on their own over the next several months.  Activity as tolerated You may return to work in 1 week.   Post Anesthesia Home Care Instructions  Activity: Get plenty of rest for the remainder of the day. A responsible adult should stay with you for 24 hours following the procedure.  For the next 24 hours, DO NOT: -Drive a car -Paediatric nurse -Drink alcoholic beverages -Take any medication unless instructed by your physician -Make any legal decisions or sign important papers.  Meals: Start with liquid foods such as gelatin or soup. Progress to regular foods as tolerated. Avoid greasy, spicy, heavy foods. If nausea and/or vomiting occur, drink only clear liquids until the nausea and/or vomiting subsides. Call your physician if vomiting continues.  Special Instructions/Symptoms: Your throat may feel dry or sore from the anesthesia or the breathing tube placed in your throat during surgery. If this causes discomfort, gargle with warm salt water. The discomfort should disappear within 24 hours.  If you had a scopolamine patch placed behind your ear for the management of post- operative nausea and/or vomiting:  1. The medication in the patch is effective for 72 hours, after which it should be removed.  Wrap patch in a tissue and discard in the trash. Wash hands thoroughly with soap and water. 2. You may remove the patch earlier than 72 hours if you experience unpleasant side effects which may include dry mouth, dizziness or visual disturbances. 3. Avoid touching the patch. Wash your  hands with soap and water after contact with the patch.

## 2016-08-17 NOTE — Op Note (Signed)
Pre-op diagnosis:  Left hip lipoma Post-op diagnosis:  Same (8 x 5 x 2 cm) Procedure:  Excision of subcutaneous lipoma - left hip Surgeon:  Donnie Mesa K. Anesthesia: Gen. via LMA Indications:  This is a 50 year old female who presents with a 10 year history of a slowly enlarging mass in her left hip the subcutaneous space. Over last month she has developed pain that radiates down into her leg and across her pelvis. His is causing some difficulty with mobility because of the discomfort. CT scan of the abdomen and pelvis showed no abnormal findings other than a subcutaneous mass in the area of tenderness. This is consistent with a subcutaneous lipoma. She presents now for excision.  Description of procedure: The patient brought to the operating room and placed in a supine position on the operating room table. After adequate level general anesthesia was obtained, her left hip was prepped with ChloraPrep and draped sterile fashion. The mass tracks just lateral to her left anterior superior iliac spine. We made a 3 cm incision over the center of the mass. Prior to the incision we infiltrated with 0.25% Marcaine with epinephrine. We dissected down through the dermis to the surface of the lipoma. I bluntly dissected completely around the lipoma. The lipoma was resting on the underlying muscular fascia. The lipoma tracked laterally for a distance of about 8 cm. We excised the entire lipoma completely intact. I inspected for any other lipomas and no other masses were noted. We inspected for hemostasis. The cavity of the lipoma was then infiltrated with more 0.25% Marcaine with epinephrine. The wound was closed with a deep layer of 3-0 Vicryl. 4 Monocryl was used to close the skin. Steri-Strips and clean dressings were applied. Patient was then extubated and brought to the recovery room in stable condition. All sponge, instrument, and needle counts are correct.  Imogene Burn. Georgette Dover, MD, [facility] Surgery   General/ Trauma Surgery  08/17/2016 9:21 AM

## 2016-08-17 NOTE — Interval H&P Note (Signed)
History and Physical Interval Note:  08/17/2016 7:58 AM  [name]  has presented today for surgery, with the diagnosis of SUBCUTANEIOUS LIPOMA LEFT HIP 4 CM  The various methods of treatment have been discussed with the patient and family. After consideration of risks, benefits and other options for treatment, the patient has consented to  Procedure(s): EXCISION OF SUBCUTANEOUS LIPOMA LEFT HIP (Left) as a surgical intervention .  The patient's history has been reviewed, patient examined, no change in status, stable for surgery.  I have reviewed the patient's chart and labs.  Questions were answered to the patient's satisfaction.     [name] K.

## 2016-08-17 NOTE — Anesthesia Procedure Notes (Signed)
Procedure Name: LMA Insertion Date/Time: 08/17/2016 8:46 AM Performed by: Baxter Flattery Pre-anesthesia Checklist: Patient identified, Emergency Drugs available, Suction available and Patient being monitored Patient Re-evaluated:Patient Re-evaluated prior to inductionOxygen Delivery Method: Circle system utilized Preoxygenation: Pre-oxygenation with 100% oxygen Intubation Type: IV induction Ventilation: Mask ventilation without difficulty LMA: LMA inserted LMA Size: 4.0 Number of attempts: 2 Airway Equipment and Method: Bite block Placement Confirmation: positive ETCO2 and breath sounds checked- equal and bilateral Tube secured with: Tape Dental Injury: Teeth and Oropharynx as per pre-operative assessment

## 2016-08-17 NOTE — Transfer of Care (Signed)
Immediate Anesthesia Transfer of Care Note  Patient: [name]  Procedure(s) Performed: Procedure(s): EXCISION OF SUBCUTANEOUS LIPOMA LEFT HIP (Left)  Patient Location: PACU  Anesthesia Type:General  Level of Consciousness: awake, alert , oriented and patient cooperative  Airway & Oxygen Therapy: Patient Spontanous Breathing and Patient connected to face mask oxygen  Post-op Assessment: Report given to RN, Post -op Vital signs reviewed and stable and Patient moving all extremities  Post vital signs: Reviewed and stable  Last Vitals:  Vitals:   08/17/16 0743  BP: 140/85  Pulse: (!) 51  Resp: 18  Temp: 36.6 C    Last Pain:  Vitals:   08/17/16 0743  TempSrc: Oral      Patients Stated Pain Goal: 0 ([id number])  Complications: No apparent anesthesia complications

## 2016-08-18 ENCOUNTER — Encounter (HOSPITAL_BASED_OUTPATIENT_CLINIC_OR_DEPARTMENT_OTHER): Payer: Self-pay | Admitting: Surgery

## 2016-09-07 ENCOUNTER — Other Ambulatory Visit: Payer: Self-pay | Admitting: Surgery

## 2016-09-07 DIAGNOSIS — M25552 Pain in left hip: Secondary | ICD-10-CM

## 2016-09-13 ENCOUNTER — Other Ambulatory Visit (HOSPITAL_COMMUNITY): Payer: Self-pay | Admitting: Surgery

## 2016-09-13 DIAGNOSIS — M79605 Pain in left leg: Secondary | ICD-10-CM

## 2016-09-13 DIAGNOSIS — M7989 Other specified soft tissue disorders: Principal | ICD-10-CM

## 2016-09-14 ENCOUNTER — Ambulatory Visit (HOSPITAL_COMMUNITY)
Admission: RE | Admit: 2016-09-14 | Discharge: 2016-09-14 | Disposition: A | Discharge status: Home / Self Care | Payer: 59 | Source: Ambulatory Visit | Attending: Internal Medicine | Admitting: Internal Medicine

## 2016-09-14 DIAGNOSIS — M79605 Pain in left leg: Secondary | ICD-10-CM | POA: Diagnosis not present

## 2016-09-14 DIAGNOSIS — M7989 Other specified soft tissue disorders: Secondary | ICD-10-CM | POA: Diagnosis not present

## 2016-09-14 NOTE — Progress Notes (Signed)
**  Preliminary report by tech**  Left lower extremity venous duplex completed. There is no evidence of deep or superficial vein thrombosis involving the left lower extremity. All visualized vessels appear patent and compressible. There is no evidence of a Baker's cyst on the left.  Results were given to [facility] at [phone].  09/14/16 2:59 PM [name] RVT

## 2016-09-15 NOTE — Progress Notes (Signed)
Please call the patient and let them know that their duplex was negative for blood clot.  We will see what the MRI shows.

## 2016-09-18 ENCOUNTER — Ambulatory Visit
Admission: RE | Admit: 2016-09-18 | Discharge: 2016-09-18 | Disposition: A | Discharge status: Home / Self Care | Payer: 59 | Source: Ambulatory Visit | Attending: Surgery | Admitting: Surgery

## 2016-09-18 DIAGNOSIS — M25552 Pain in left hip: Secondary | ICD-10-CM | POA: Diagnosis not present

## 2016-09-18 MED ORDER — GADOBENATE DIMEGLUMINE 529 MG/ML IV SOLN
15.0000 mL | Freq: Once | INTRAVENOUS | Status: AC | PRN
  Administered 2016-09-18: 15 mL via INTRAVENOUS

## 2016-09-19 ENCOUNTER — Other Ambulatory Visit: Payer: Self-pay | Admitting: Surgery

## 2016-09-19 DIAGNOSIS — M25552 Pain in left hip: Secondary | ICD-10-CM

## 2016-09-21 ENCOUNTER — Ambulatory Visit
Admission: RE | Admit: 2016-09-21 | Discharge: 2016-09-21 | Disposition: A | Discharge status: Home / Self Care | Payer: 59 | Source: Ambulatory Visit | Attending: Surgery | Admitting: Surgery

## 2016-09-21 DIAGNOSIS — M25552 Pain in left hip: Secondary | ICD-10-CM

## 2016-09-21 MED FILL — OMEPRAZOLE DR 40 MG CAPSULE: 40 | 30 days supply | Qty: 30 | Fill #2

## 2016-09-21 NOTE — Progress Notes (Signed)
Please refer the patient to Orthopedics to evaluate for left hip pain.  Let her know that there may be some slight degeneration in her left hip joint.  No problems related to the surgery

## 2016-10-13 DIAGNOSIS — M25552 Pain in left hip: Secondary | ICD-10-CM | POA: Diagnosis not present

## 2016-10-13 MED FILL — predniSONE 10 MG TABS: 10 | 6 days supply | Qty: 21 | Fill #0

## 2016-11-10 DIAGNOSIS — M25552 Pain in left hip: Secondary | ICD-10-CM | POA: Diagnosis not present

## 2016-11-15 MED FILL — OMEPRAZOLE DR 40 MG CAPSULE: 40 | 30 days supply | Qty: 30 | Fill #3

## 2016-11-15 MED FILL — predniSONE 10 MG TABS: 10 | 12 days supply | Qty: 21 | Fill #0

## 2016-12-08 DIAGNOSIS — M25552 Pain in left hip: Secondary | ICD-10-CM | POA: Diagnosis not present

## 2016-12-14 DIAGNOSIS — M25552 Pain in left hip: Secondary | ICD-10-CM | POA: Diagnosis not present

## 2016-12-20 DIAGNOSIS — M25552 Pain in left hip: Secondary | ICD-10-CM | POA: Diagnosis not present

## 2016-12-20 DIAGNOSIS — M7062 Trochanteric bursitis, left hip: Secondary | ICD-10-CM | POA: Diagnosis not present

## 2017-03-24 MED FILL — OMEPRAZOLE DR 40 MG CAPSULE: 40 | 60 days supply | Qty: 60 | Fill #4

## 2017-05-31 DIAGNOSIS — H5213 Myopia, bilateral: Secondary | ICD-10-CM | POA: Diagnosis not present

## 2017-05-31 DIAGNOSIS — H524 Presbyopia: Secondary | ICD-10-CM | POA: Diagnosis not present

## 2017-07-26 ENCOUNTER — Other Ambulatory Visit: Payer: Self-pay | Admitting: Obstetrics and Gynecology

## 2017-07-26 DIAGNOSIS — Z01419 Encounter for gynecological examination (general) (routine) without abnormal findings: Secondary | ICD-10-CM | POA: Diagnosis not present

## 2017-07-26 DIAGNOSIS — Z6825 Body mass index (BMI) 25.0-25.9, adult: Secondary | ICD-10-CM | POA: Diagnosis not present

## 2017-07-26 DIAGNOSIS — Z1151 Encounter for screening for human papillomavirus (HPV): Secondary | ICD-10-CM | POA: Diagnosis not present

## 2017-07-26 DIAGNOSIS — R232 Flushing: Secondary | ICD-10-CM | POA: Diagnosis not present

## 2017-07-26 DIAGNOSIS — R300 Dysuria: Secondary | ICD-10-CM | POA: Diagnosis not present

## 2017-07-26 DIAGNOSIS — Z1231 Encounter for screening mammogram for malignant neoplasm of breast: Secondary | ICD-10-CM

## 2017-07-26 DIAGNOSIS — R102 Pelvic and perineal pain: Secondary | ICD-10-CM | POA: Diagnosis not present

## 2017-08-02 ENCOUNTER — Ambulatory Visit
Admission: RE | Admit: 2017-08-02 | Discharge: 2017-08-02 | Disposition: A | Discharge status: Home / Self Care | Payer: 59 | Source: Ambulatory Visit | Attending: Obstetrics and Gynecology | Admitting: Obstetrics and Gynecology

## 2017-08-02 DIAGNOSIS — Z1231 Encounter for screening mammogram for malignant neoplasm of breast: Secondary | ICD-10-CM | POA: Diagnosis not present

## 2017-08-03 MED FILL — PREMARIN 0.625 MG TABLET: 0.625 | 30 days supply | Qty: 30 | Fill #0

## 2017-09-06 IMAGING — MG 2D DIGITAL SCREENING BILATERAL MAMMOGRAM WITH CAD AND ADJUNCT TO
9 of 12 series · 9 of 28 positions shown · non-contrast
Comparison: Previous exam(s).

CLINICAL DATA: Screening.

EXAM:
2D DIGITAL SCREENING BILATERAL MAMMOGRAM WITH CAD AND ADJUNCT TOMO

[R CC synth-2D]
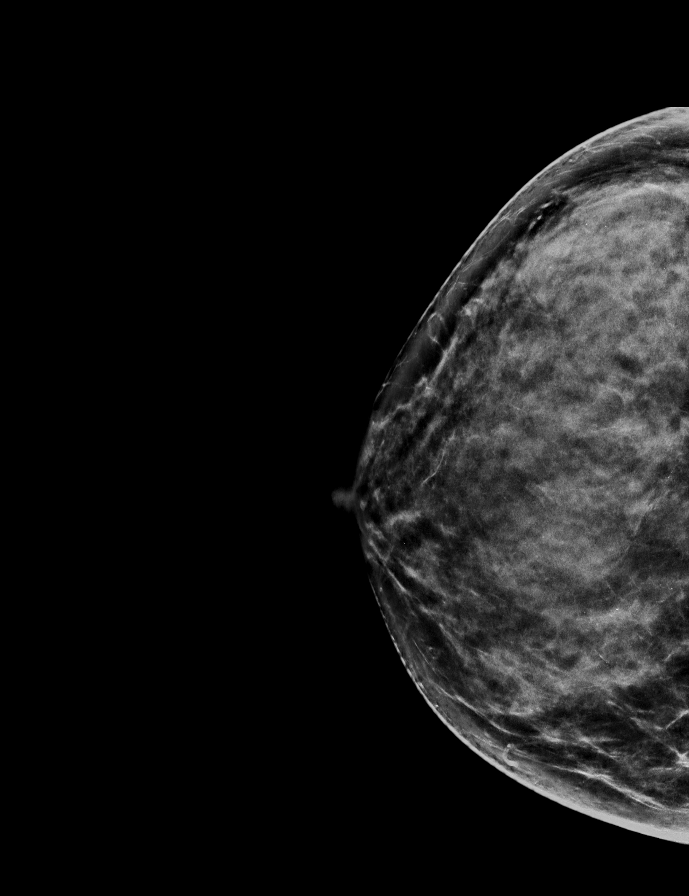

[R MLO]
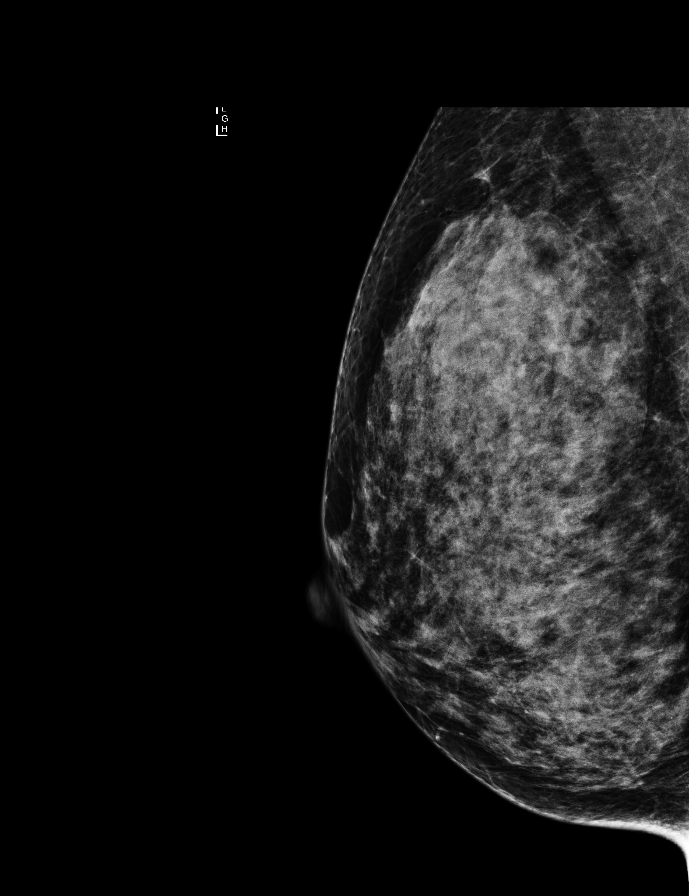

[L CC]
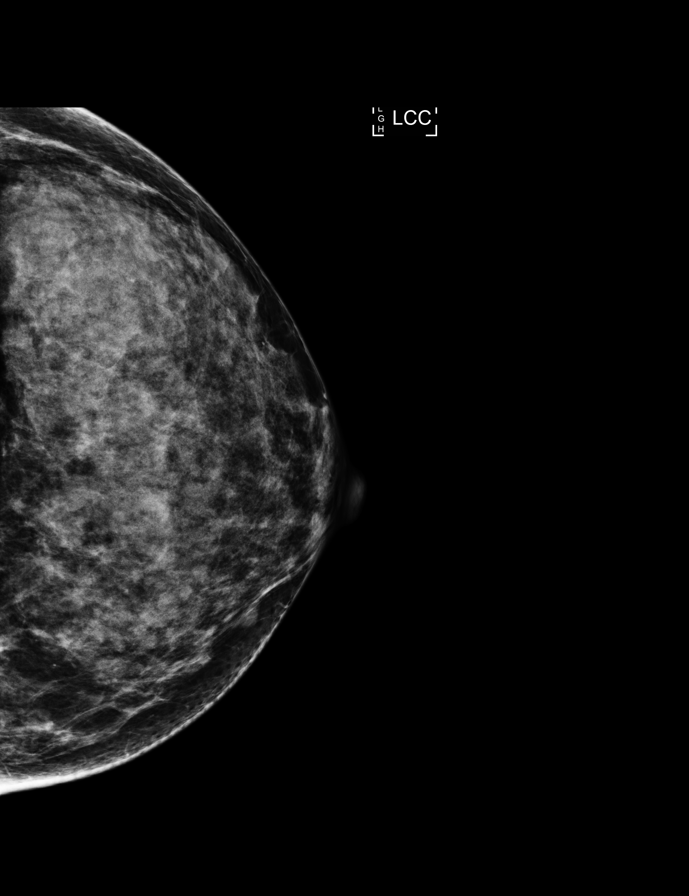

[R MLO synth-2D]
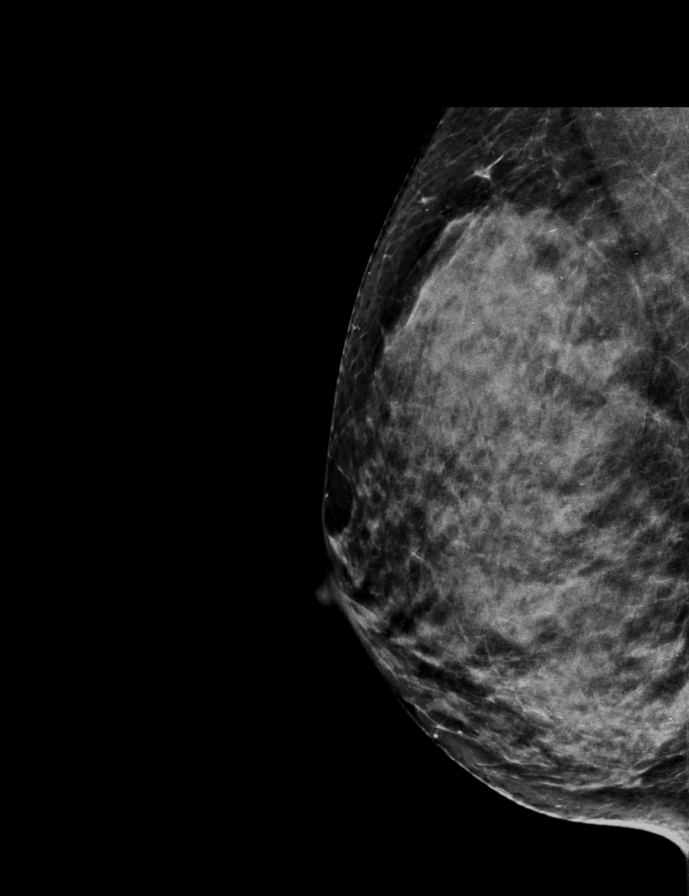

[L MLO synth-2D]
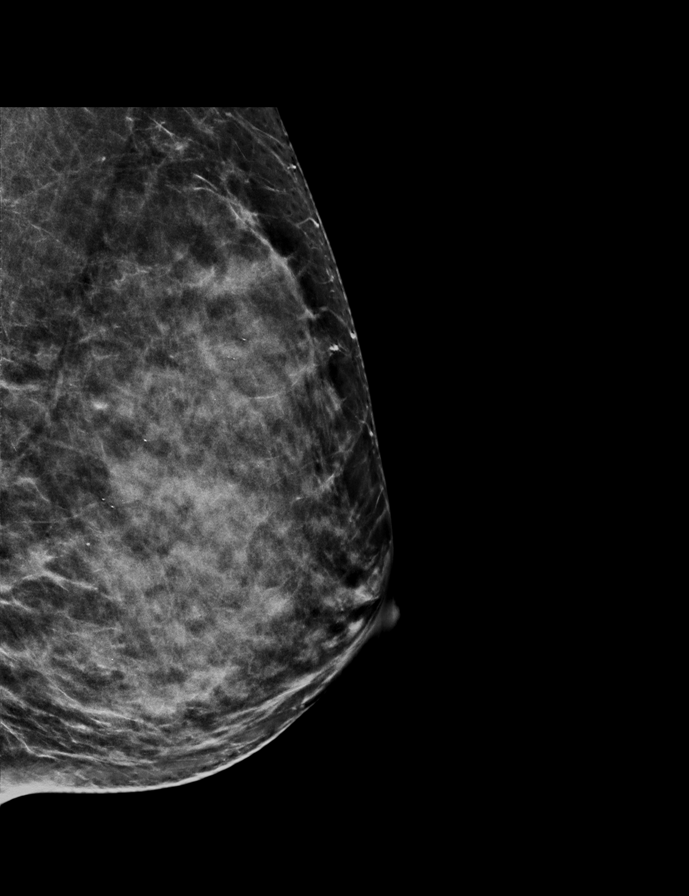

[L CC synth-2D]
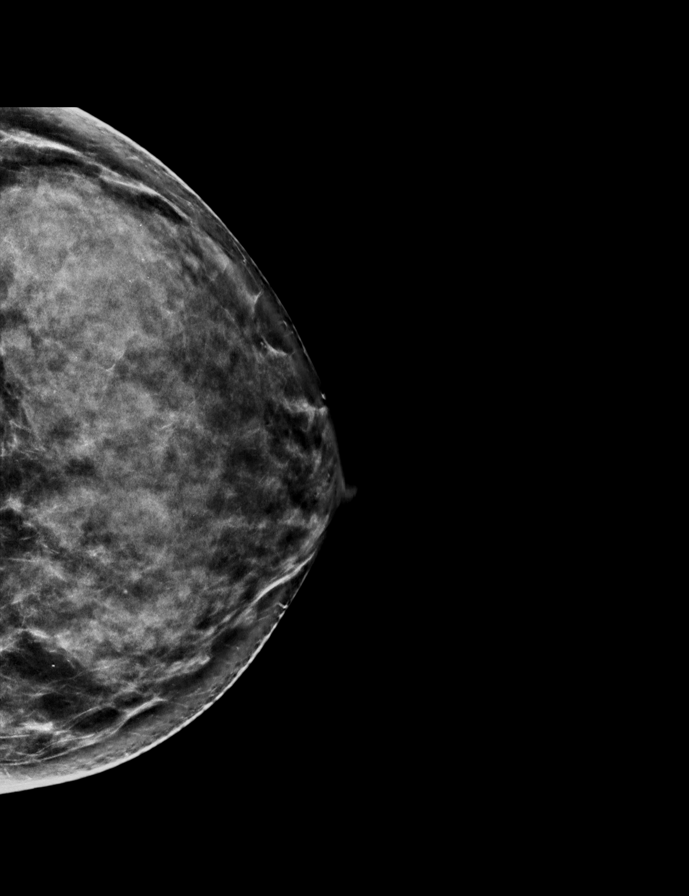

[R CC]
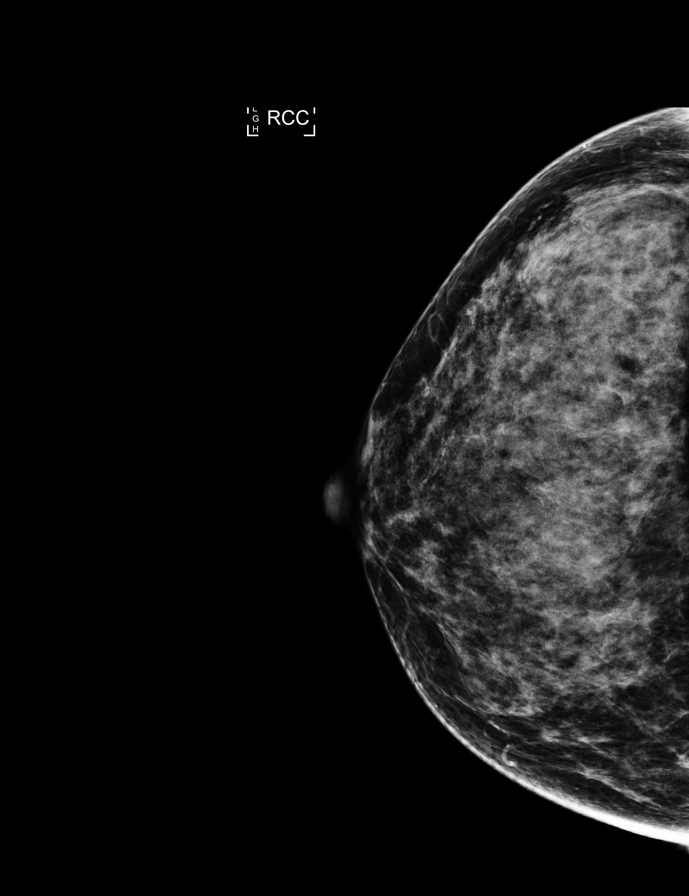

[L MLO]
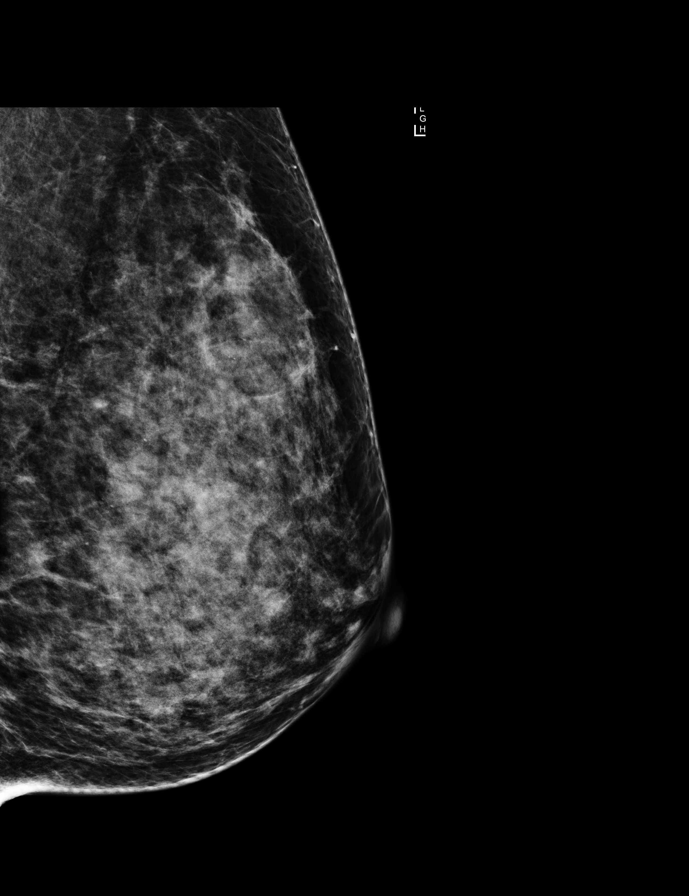

[R MLO tomo · tomo slice 35/68.0]
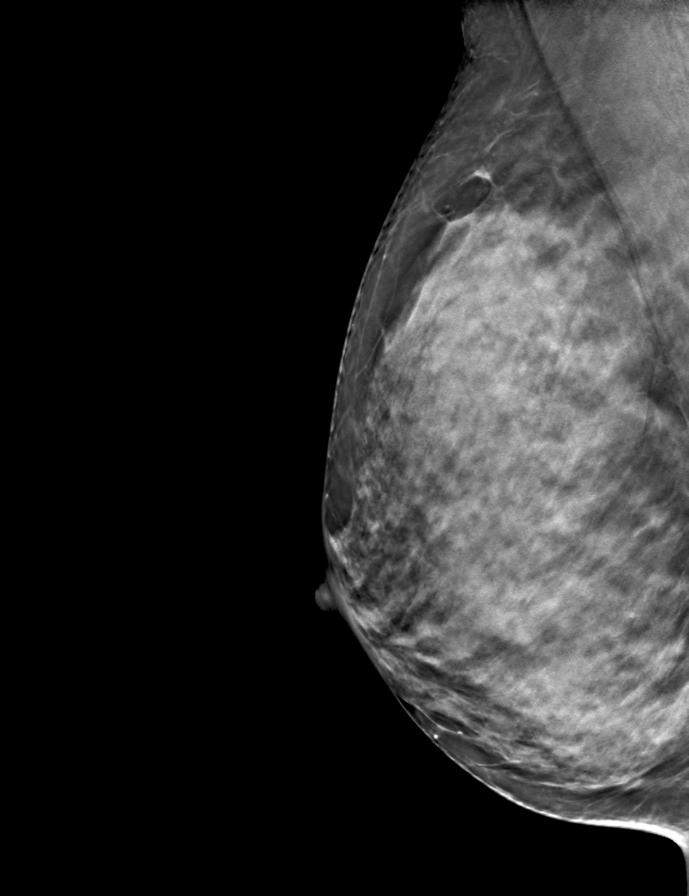

[9 of 28 positions shown; findings below may reference images not displayed]

ACR Breast Density Category c: The breast tissue is heterogeneously
dense, which may obscure small masses.
FINDINGS: There are no findings suspicious for malignancy. Images were
processed with CAD.
IMPRESSION: No mammographic evidence of malignancy. A result letter of this
screening mammogram will be mailed directly to the patient.

RECOMMENDATION:
Screening mammogram in one year. (Code:TN-[id number])

BI-RADS CATEGORY  1: Negative.

## 2017-09-25 DIAGNOSIS — H6122 Impacted cerumen, left ear: Secondary | ICD-10-CM | POA: Diagnosis not present

## 2017-09-25 DIAGNOSIS — H902 Conductive hearing loss, unspecified: Secondary | ICD-10-CM | POA: Diagnosis not present

## 2018-03-22 DIAGNOSIS — M722 Plantar fascial fibromatosis: Secondary | ICD-10-CM | POA: Diagnosis not present

## 2018-03-22 DIAGNOSIS — M79671 Pain in right foot: Secondary | ICD-10-CM | POA: Diagnosis not present

## 2018-03-22 MED FILL — predniSONE 10 MG TABS: 10 | 12 days supply | Qty: 21 | Fill #0

## 2018-04-27 ENCOUNTER — Ambulatory Visit (INDEPENDENT_AMBULATORY_CARE_PROVIDER_SITE_OTHER): Payer: Self-pay | Admitting: Family Medicine

## 2018-04-27 VITALS — BP 112/78 | HR 57 | Temp 98.3°F | Resp 18 | Ht 70.0 in | Wt 171.6 lb

## 2018-04-27 DIAGNOSIS — Z0000 Encounter for general adult medical examination without abnormal findings: Secondary | ICD-10-CM

## 2018-04-27 NOTE — Progress Notes (Signed)
[name] is a 52 y.o. female who presents today with concerns of a annual physical for work. She denies and chronic health conations and reports she feels in general good health. She request information on how to obtain a PCP.   Review of Systems  Constitutional: Negative for chills, fever and malaise/fatigue.  HENT: Negative for congestion, ear discharge, ear pain, sinus pain and sore throat.   Eyes: Negative.   Respiratory: Negative for cough, sputum production and shortness of breath.   Cardiovascular: Negative.  Negative for chest pain.  Gastrointestinal: Negative for abdominal pain, diarrhea, nausea and vomiting.  Genitourinary: Negative for dysuria, frequency, hematuria and urgency.  Musculoskeletal: Negative for myalgias.  Skin: Negative.   Neurological: Negative for headaches.  Endo/Heme/Allergies: Negative.   Psychiatric/Behavioral: Negative.     O: Vitals:   04/27/18 1456  BP: 112/78  Pulse: (!) 57  Resp: 18  Temp: 98.3 F (36.8 C)  SpO2: 99%     Physical Exam  Constitutional: She is oriented to person, place, and time. Vital signs are normal. She appears well-developed and well-nourished. She is active.  Non-toxic appearance. She does not have a sickly appearance.  HENT:  Head: Normocephalic.  Right Ear: Hearing, tympanic membrane, external ear and ear canal normal.  Left Ear: Hearing, tympanic membrane, external ear and ear canal normal.  Nose: Nose normal.  Mouth/Throat: Uvula is midline and oropharynx is clear and moist.  Neck: Normal range of motion. Neck supple.  Cardiovascular: Normal rate, regular rhythm, normal heart sounds and normal pulses.  Pulmonary/Chest: Effort normal and breath sounds normal.  Abdominal: Soft. Bowel sounds are normal.  Musculoskeletal: Normal range of motion.  Lymphadenopathy:       Head (right side): No submental and no submandibular adenopathy present.       Head (left side): No submental and no submandibular adenopathy  present.    She has no cervical adenopathy.  Neurological: She is alert and oriented to person, place, and time.  Psychiatric: She has a normal mood and affect.  Vitals reviewed.  A: 1. Physical exam    P: Exam findings, diagnosis etiology and medication use and indications reviewed with patient. Follow- Up and discharge instructions provided. No emergent/urgent issues found on exam.  Patient verbalized understanding of information provided and agrees with plan of care (POC), all questions answered.  1. Physical exam WNL- information on how to obtain PCP provided

## 2018-04-27 NOTE — Patient Instructions (Signed)

## 2018-04-30 ENCOUNTER — Ambulatory Visit: Payer: 59 | Admitting: Nurse Practitioner

## 2018-06-20 DIAGNOSIS — H524 Presbyopia: Secondary | ICD-10-CM | POA: Diagnosis not present

## 2018-06-20 DIAGNOSIS — H5213 Myopia, bilateral: Secondary | ICD-10-CM | POA: Diagnosis not present

## 2018-08-17 ENCOUNTER — Ambulatory Visit
Admission: RE | Admit: 2018-08-17 | Discharge: 2018-08-17 | Disposition: A | Discharge status: Home / Self Care | Payer: 59 | Source: Ambulatory Visit | Attending: Obstetrics and Gynecology | Admitting: Obstetrics and Gynecology

## 2018-08-17 ENCOUNTER — Other Ambulatory Visit: Payer: Self-pay | Admitting: Obstetrics and Gynecology

## 2018-08-17 DIAGNOSIS — Z1231 Encounter for screening mammogram for malignant neoplasm of breast: Secondary | ICD-10-CM

## 2018-09-05 ENCOUNTER — Ambulatory Visit: Payer: 59 | Admitting: Podiatry

## 2018-09-05 ENCOUNTER — Encounter: Payer: Self-pay | Admitting: Podiatry

## 2018-09-05 ENCOUNTER — Ambulatory Visit (INDEPENDENT_AMBULATORY_CARE_PROVIDER_SITE_OTHER): Payer: 59

## 2018-09-05 VITALS — BP 133/78 | HR 49

## 2018-09-05 DIAGNOSIS — M7752 Other enthesopathy of left foot: Secondary | ICD-10-CM

## 2018-09-05 DIAGNOSIS — M722 Plantar fascial fibromatosis: Secondary | ICD-10-CM

## 2018-09-05 DIAGNOSIS — M7751 Other enthesopathy of right foot: Secondary | ICD-10-CM | POA: Diagnosis not present

## 2018-09-05 DIAGNOSIS — M7750 Other enthesopathy of unspecified foot: Secondary | ICD-10-CM

## 2018-09-05 MED ORDER — IBUPROFEN 600 MG PO TABS: 600.0000 mg | ORAL_TABLET | Freq: Three times a day (TID) | ORAL | 1 refills | Status: DC | PRN

## 2018-09-05 MED ORDER — METHYLPREDNISOLONE 4 MG PO TBPK: ORAL_TABLET | ORAL | 0 refills | Status: DC

## 2018-09-05 MED FILL — IBUPROFEN 600 MG TABLET: 600 | 30 days supply | Qty: 90 | Fill #0

## 2018-09-05 MED FILL — METHYLPREDNISOLONE 4 MG TAB: 4 | 6 days supply | Qty: 21 | Fill #0

## 2018-09-09 NOTE — Progress Notes (Signed)
   Subjective: 52 year old female presenting today as a new patient with a chief complaint of pain to the bilateral heels that has gotten gradually worse over the past few years. She states she has always had problems with her feet because she is on them all day. She states she received an injection for treatment back in May (6 months ago) which provided some relief. She has not done anything at home for treatment. Patient is here for further evaluation and treatment.   Past Medical History:  Diagnosis Date  . Arthritis   . Family history of adverse reaction to anesthesia    mom has ponv  . GERD (gastroesophageal reflux disease)      Objective: Physical Exam General: The patient is alert and oriented x3 in no acute distress.  Dermatology: Skin is warm, dry and supple bilateral lower extremities. Negative for open lesions or macerations bilateral.   Vascular: Dorsalis Pedis and Posterior Tibial pulses palpable bilateral.  Capillary fill time is immediate to all digits.  Neurological: Epicritic and protective threshold intact bilateral.   Musculoskeletal: Tenderness to palpation to the plantar aspect of the bilateral heels along the plantar fascia. All other joints range of motion within normal limits bilateral. Strength 5/5 in all groups bilateral.   Radiographic exam: Normal osseous mineralization. Joint spaces preserved. No fracture/dislocation/boney destruction. No other soft tissue abnormalities or radiopaque foreign bodies.   Assessment: 1. plantar fasciitis bilateral feet  Plan of Care:  1. Patient evaluated. Xrays reviewed.   2. Injection of 0.5cc Celestone soluspan injected into the bilateral heels.  3. Rx for Medrol Dose Pak placed 4. Rx for Motrin 600 mg twice daily ordered for patient. 5. Plantar fascial band(s) dispensed for bilateral plantar fasciitis. 6. Instructed patient regarding therapies and modalities at home to alleviate symptoms.  7. Appointment with Liliane Channel,  Pedorthist, for custom orthotics.  8. Return to clinic in 8 weeks.    Edrick Kins, DPM [facility]  Dr. Edrick Kins, DPM    [address], [place] [address]                Office [phone]  Fax [phone]

## 2018-09-13 ENCOUNTER — Ambulatory Visit (INDEPENDENT_AMBULATORY_CARE_PROVIDER_SITE_OTHER): Payer: 59 | Admitting: Orthotics

## 2018-09-13 DIAGNOSIS — M722 Plantar fascial fibromatosis: Secondary | ICD-10-CM | POA: Diagnosis not present

## 2018-09-13 NOTE — Progress Notes (Signed)

## 2018-10-08 ENCOUNTER — Encounter: Payer: 59 | Admitting: Orthotics

## 2018-10-08 DIAGNOSIS — Z1329 Encounter for screening for other suspected endocrine disorder: Secondary | ICD-10-CM | POA: Diagnosis not present

## 2018-10-08 DIAGNOSIS — Z130 Encounter for screening for diseases of the blood and blood-forming organs and certain disorders involving the immune mechanism: Secondary | ICD-10-CM | POA: Diagnosis not present

## 2018-10-08 DIAGNOSIS — Z01419 Encounter for gynecological examination (general) (routine) without abnormal findings: Secondary | ICD-10-CM | POA: Diagnosis not present

## 2018-10-08 DIAGNOSIS — R102 Pelvic and perineal pain: Secondary | ICD-10-CM | POA: Diagnosis not present

## 2018-10-08 DIAGNOSIS — Z0000 Encounter for general adult medical examination without abnormal findings: Secondary | ICD-10-CM | POA: Diagnosis not present

## 2018-10-08 DIAGNOSIS — D252 Subserosal leiomyoma of uterus: Secondary | ICD-10-CM | POA: Diagnosis not present

## 2018-10-08 DIAGNOSIS — Z13220 Encounter for screening for lipoid disorders: Secondary | ICD-10-CM | POA: Diagnosis not present

## 2018-10-08 DIAGNOSIS — Z131 Encounter for screening for diabetes mellitus: Secondary | ICD-10-CM | POA: Diagnosis not present

## 2018-10-08 DIAGNOSIS — D251 Intramural leiomyoma of uterus: Secondary | ICD-10-CM | POA: Diagnosis not present

## 2018-10-08 DIAGNOSIS — Z1151 Encounter for screening for human papillomavirus (HPV): Secondary | ICD-10-CM | POA: Diagnosis not present

## 2018-10-08 DIAGNOSIS — Z30432 Encounter for removal of intrauterine contraceptive device: Secondary | ICD-10-CM | POA: Diagnosis not present

## 2018-10-08 DIAGNOSIS — Z6826 Body mass index (BMI) 26.0-26.9, adult: Secondary | ICD-10-CM | POA: Diagnosis not present

## 2018-10-29 ENCOUNTER — Ambulatory Visit (INDEPENDENT_AMBULATORY_CARE_PROVIDER_SITE_OTHER): Payer: 59 | Admitting: Podiatry

## 2018-10-29 DIAGNOSIS — M722 Plantar fascial fibromatosis: Secondary | ICD-10-CM | POA: Diagnosis not present

## 2018-10-31 NOTE — Progress Notes (Signed)
   Subjective: 53 year old female presenting today for follow up evaluation of bilateral plantar fasciitis. She states her pain has improved and the injections and medications have helped. She has been using the insoles she received and states they are helping to alleviate the pain as well. She reports some continued intermittent mild aching of the right plantar heel.  She also reports a painful nodule to the plantar aspect of the left foot that has been present for several weeks. Walking and bearing weight increases the pain. Patient is here for further evaluation and treatment.   Past Medical History:  Diagnosis Date  . Arthritis   . Family history of adverse reaction to anesthesia    mom has ponv  . GERD (gastroesophageal reflux disease)      Objective: Physical Exam General: The patient is alert and oriented x3 in no acute distress.  Dermatology: Skin is warm, dry and supple bilateral lower extremities. Negative for open lesions or macerations bilateral.   Vascular: Dorsalis Pedis and Posterior Tibial pulses palpable bilateral.  Capillary fill time is immediate to all digits.  Neurological: Epicritic and protective threshold intact bilateral.   Musculoskeletal: Tenderness to palpation to the plantar aspect of the bilateral heels along the plantar fascia and along the lateral band of the right heel. Palpable nodule noted to the plantar medial longitudinal arch of the left foot. Pain with palpation also noted to the area. All other joints range of motion within normal limits bilateral. Strength 5/5 in all groups bilateral.   Assessment: 1. plantar fasciitis bilateral feet - improved 2. Pain lateral band right heel 3. Plantar fibroma left   Plan of Care:  1. Patient evaluated.  2. Continue using custom orthotics. Patient states the orthotics are helping significantly.  3. Continue taking Motrin 600 mg as needed.  4. Recommended good shoe gear.  5. Return to clinic in 4 weeks.    Edrick Kins, DPM [facility]  Dr. Edrick Kins, DPM    [address], [place] [address]                Office [phone]  Fax [phone]

## 2018-12-12 ENCOUNTER — Ambulatory Visit (INDEPENDENT_AMBULATORY_CARE_PROVIDER_SITE_OTHER): Payer: 59 | Admitting: Podiatry

## 2018-12-12 ENCOUNTER — Encounter: Payer: Self-pay | Admitting: Podiatry

## 2018-12-12 DIAGNOSIS — M722 Plantar fascial fibromatosis: Secondary | ICD-10-CM | POA: Diagnosis not present

## 2018-12-12 MED ORDER — METHYLPREDNISOLONE 4 MG PO TBPK: ORAL_TABLET | ORAL | 0 refills | Status: DC

## 2018-12-14 MED FILL — IBUPROFEN 600 MG TABLET: 600 | 30 days supply | Qty: 90 | Fill #1

## 2018-12-17 NOTE — Progress Notes (Signed)
   Subjective: 53 year old female presenting today for follow up evaluation of bilateral foot pain. She states her pain has improved but she is still experiencing tenderness in the right heel. She states the pain is worse in the morning. She reports some relief after the injection she received previously. She has been taking Motrin for pain. Patient is here for further evaluation and treatment.   Past Medical History:  Diagnosis Date  . Arthritis   . Family history of adverse reaction to anesthesia    mom has ponv  . GERD (gastroesophageal reflux disease)      Objective: Physical Exam General: The patient is alert and oriented x3 in no acute distress.  Dermatology: Skin is warm, dry and supple bilateral lower extremities. Negative for open lesions or macerations bilateral.   Vascular: Dorsalis Pedis and Posterior Tibial pulses palpable bilateral.  Capillary fill time is immediate to all digits.  Neurological: Epicritic and protective threshold intact bilateral.   Musculoskeletal: Tenderness to palpation to the plantar aspect of the right heel along the plantar fascia. All other joints range of motion within normal limits bilateral. Strength 5/5 in all groups bilateral.   Assessment: 1. Plantar fasciitis right  Plan of Care:  1. Patient evaluated. 2. Injecti of 0.5cc Celestone soluspan injected into the right plantar fascia  3. Continue using custom orthotics.  4. Prescription for Medrol Dose Pak provided to patient. Then continue taking Motrin 600 mg.  5. Return to clinic as needed.     Edrick Kins, DPM [facility]  Dr. Edrick Kins, DPM    [address], [place] [address]                Office [phone]  Fax [phone]

## 2019-08-30 ENCOUNTER — Other Ambulatory Visit: Payer: Self-pay | Admitting: Obstetrics and Gynecology

## 2019-08-30 DIAGNOSIS — Z1231 Encounter for screening mammogram for malignant neoplasm of breast: Secondary | ICD-10-CM

## 2019-10-21 ENCOUNTER — Ambulatory Visit: Payer: 59

## 2019-10-29 ENCOUNTER — Other Ambulatory Visit: Payer: Self-pay

## 2019-10-29 ENCOUNTER — Ambulatory Visit
Admission: RE | Admit: 2019-10-29 | Discharge: 2019-10-29 | Disposition: A | Discharge status: Home / Self Care | Payer: 59 | Source: Ambulatory Visit | Attending: Obstetrics and Gynecology | Admitting: Obstetrics and Gynecology

## 2019-10-29 DIAGNOSIS — Z1231 Encounter for screening mammogram for malignant neoplasm of breast: Secondary | ICD-10-CM

## 2019-11-06 DIAGNOSIS — Z6825 Body mass index (BMI) 25.0-25.9, adult: Secondary | ICD-10-CM | POA: Diagnosis not present

## 2019-11-06 DIAGNOSIS — Z1151 Encounter for screening for human papillomavirus (HPV): Secondary | ICD-10-CM | POA: Diagnosis not present

## 2019-11-06 DIAGNOSIS — Z01419 Encounter for gynecological examination (general) (routine) without abnormal findings: Secondary | ICD-10-CM | POA: Diagnosis not present

## 2019-12-25 DIAGNOSIS — H524 Presbyopia: Secondary | ICD-10-CM | POA: Diagnosis not present

## 2019-12-25 DIAGNOSIS — H5213 Myopia, bilateral: Secondary | ICD-10-CM | POA: Diagnosis not present

## 2020-01-29 ENCOUNTER — Other Ambulatory Visit: Payer: Self-pay

## 2020-01-29 ENCOUNTER — Ambulatory Visit (INDEPENDENT_AMBULATORY_CARE_PROVIDER_SITE_OTHER): Payer: 59 | Admitting: Otolaryngology

## 2020-01-29 ENCOUNTER — Encounter (INDEPENDENT_AMBULATORY_CARE_PROVIDER_SITE_OTHER): Payer: Self-pay | Admitting: Otolaryngology

## 2020-01-29 VITALS — Temp 97.2°F

## 2020-01-29 DIAGNOSIS — H9011 Conductive hearing loss, unilateral, right ear, with unrestricted hearing on the contralateral side: Secondary | ICD-10-CM | POA: Diagnosis not present

## 2020-01-29 DIAGNOSIS — J310 Chronic rhinitis: Secondary | ICD-10-CM

## 2020-01-29 DIAGNOSIS — H6123 Impacted cerumen, bilateral: Secondary | ICD-10-CM

## 2020-01-29 NOTE — Progress Notes (Signed)
HPI: [name] is a 54 y.o. female who presents for evaluation of cerumen buildup.  Patient is a patient of Dr. Berle Mull.. She had wax buildup of both ear canals secondary to use of a stethoscope.  She presents here to have her ears cleaned.  She has always had a hearing loss in the right ear for a number of years.  I reviewed her hearing test performed here in 2016 that demonstrated a 20-30 dB right ear conductive hearing loss with SRT's of 45 dB on the right and 10 dB on the left.  She had previously seen Dr. Thornell Mule who did discuss possible surgery with her. She also complains of postnasal drainage.  Past Medical History:  Diagnosis Date  . Arthritis   . Family history of adverse reaction to anesthesia    mom has ponv  . GERD (gastroesophageal reflux disease)    Past Surgical History:  Procedure Laterality Date  . LIPOMA EXCISION Left 08/17/2016   Procedure: EXCISION OF SUBCUTANEOUS LIPOMA LEFT HIP;  Surgeon: Donnie Mesa, MD;  Location: [facility];  Service: General;  Laterality: Left;   Social History   Socioeconomic History  . Marital status: Married    Spouse name: Not on file  . Number of children: Not on file  . Years of education: Not on file  . Highest education level: Not on file  Occupational History  . Not on file  Tobacco Use  . Smoking status: Never Smoker  . Smokeless tobacco: Never Used  Substance and Sexual Activity  . Alcohol use: Yes  . Drug use: No  . Sexual activity: Not on file  Other Topics Concern  . Not on file  Social History Narrative  . Not on file   Social Determinants of Health   Financial Resource Strain:   . Difficulty of Paying Living Expenses:   Food Insecurity:   . Worried About Charity fundraiser in the Last Year:   . Arboriculturist in the Last Year:   Transportation Needs:   . Film/video editor (Medical):   Marland Kitchen Lack of Transportation (Non-Medical):   Physical Activity:   . Days of Exercise per Week:    . Minutes of Exercise per Session:   Stress:   . Feeling of Stress :   Social Connections:   . Frequency of Communication with Friends and Family:   . Frequency of Social Gatherings with Friends and Family:   . Attends Religious Services:   . Active Member of Clubs or Organizations:   . Attends Archivist Meetings:   Marland Kitchen Marital Status:    Family History  Problem Relation Age of Onset  . Breast cancer Mother    Allergies  Allergen Reactions  . Compazine [Prochlorperazine Edisylate] Other (See Comments)    angioedema  . Flexeril [Cyclobenzaprine] Other (See Comments)    Severe confusion  . Reglan [Metoclopramide] Other (See Comments)    Angioedema    Prior to Admission medications   Medication Sig Start Date End Date Taking? Authorizing Provider  acetaminophen (TYLENOL) 500 MG tablet Take 500 mg by mouth every 6 (six) hours as needed.    [provider]  HYDROcodone-acetaminophen (NORCO/VICODIN) 5-325 MG tablet Take 1-2 tablets by mouth every 6 (six) hours as needed for moderate pain. 08/17/16   Donnie Mesa, MD  ibuprofen (ADVIL,MOTRIN) 600 MG tablet Take 1 tablet (600 mg total) by mouth every 8 (eight) hours as needed. 09/05/18   Edrick Kins,  DPM  methylPREDNISolone (MEDROL DOSEPAK) 4 MG TBPK tablet 6 day dose pack - take as directed 12/12/18   Edrick Kins, DPM  naproxen sodium (ANAPROX) 220 MG tablet Take 220 mg by mouth 2 (two) times daily with a meal.    [provider]  omeprazole (PRILOSEC) 40 MG capsule Take 40 mg by mouth daily.    [provider]     Positive ROS: Otherwise negative  All other systems have been reviewed and were otherwise negative with the exception of those mentioned in the HPI and as above.  Physical Exam: Constitutional: Alert, well-appearing, no acute distress Ears: External ears without lesions or tenderness. Ear canals reveal impacted cerumen deep within both ear canals that was cleaned with curette  and forceps.  TMs were clear bilaterally with good mobility on pneumatic otoscopy.  On tuning fork testing she heard better on the left side with Weber lateralizing to the right ear.. Nasal: External nose without lesions.  Mild rhinitis.  After decongesting the nose both middle meatus regions were clear with no signs of infection going clear mucus discharge noted within the nasal cavity. Oral: Oropharynx clear. Neck: No palpable adenopathy or masses Respiratory: Breathing comfortably  Skin: No facial/neck lesions or rash noted.  Cerumen impaction removal  Date/Time: 01/29/2020 1:26 PM Performed by: Kellie Nunnery, MD Authorized by: Kellie Nunnery, MD   Consent:    Consent obtained:  Verbal   Consent given by:  Patient   Risks discussed:  Pain and bleeding Procedure details:    Location:  L ear and R ear   Procedure type: curette and forceps   Post-procedure details:    Inspection:  TM intact and canal normal   Hearing quality:  Improved   Patient tolerance of procedure:  Tolerated well, no immediate complications Comments:     TMs are clear bilaterally.    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 Mild rhinitis with no signs of infection  Plan: Briefly reviewed with her concerning possible surgical options for her ear versus a hearing aid for the right ear.  If she elected surgery would recommend possibly Dr. Benjamine Mola or Dr. Thornell Mule For postnasal drainage suggested using either antihistamine or nasal steroid spray Nasacort or Flonase. Recommended follow-up on an annual basis to have her ears: Or earlier if she has any problems.  Radene Journey, MD

## 2020-01-30 ENCOUNTER — Encounter (INDEPENDENT_AMBULATORY_CARE_PROVIDER_SITE_OTHER): Payer: Self-pay

## 2020-02-05 ENCOUNTER — Ambulatory Visit: Payer: 59 | Admitting: Podiatry

## 2020-02-26 ENCOUNTER — Ambulatory Visit (INDEPENDENT_AMBULATORY_CARE_PROVIDER_SITE_OTHER): Payer: 59

## 2020-02-26 ENCOUNTER — Ambulatory Visit: Payer: 59 | Admitting: Podiatry

## 2020-02-26 ENCOUNTER — Other Ambulatory Visit: Payer: Self-pay

## 2020-02-26 DIAGNOSIS — M722 Plantar fascial fibromatosis: Secondary | ICD-10-CM

## 2020-02-26 MED ORDER — METHYLPREDNISOLONE 4 MG PO TBPK
ORAL_TABLET | ORAL | 0 refills | Status: DC
Start: 2020-02-26 — End: 2021-05-24

## 2020-02-26 MED FILL — METHYLPREDNISOLONE 4 MG TBP: 4 | 6 days supply | Qty: 21 | Fill #0

## 2020-02-28 NOTE — Progress Notes (Signed)
   Subjective: 54 y.o. female presenting today for follow up evaluation of bilateral foot pain. She states she has been using her old orthotics but they are worn out and do not help alleviate her symptoms. She is interested in new ones at this time. Being on her feet for long periods of time increases her pain. Patient is here for further evaluation and treatment.   Past Medical History:  Diagnosis Date  . Arthritis   . Family history of adverse reaction to anesthesia    mom has ponv  . GERD (gastroesophageal reflux disease)      Objective: Physical Exam General: The patient is alert and oriented x3 in no acute distress.  Dermatology: Skin is warm, dry and supple bilateral lower extremities. Negative for open lesions or macerations bilateral.   Vascular: Dorsalis Pedis and Posterior Tibial pulses palpable bilateral.  Capillary fill time is immediate to all digits.  Neurological: Epicritic and protective threshold intact bilateral.   Musculoskeletal: Tenderness to palpation to the plantar aspect of the bilateral heels along the plantar fascia. All other joints range of motion within normal limits bilateral. Strength 5/5 in all groups bilateral.   Radiographic exam: Normal osseous mineralization. Joint spaces preserved. No fracture/dislocation/boney destruction. No other soft tissue abnormalities or radiopaque foreign bodies.   Assessment: 1. plantar fasciitis bilateral feet  Plan of Care:  1. Patient evaluated. Xrays reviewed.   2. Patient declined injections.   3. Rx for Medrol Dose Pak placed 4. Appointment with Pedorthist for custom molded orthotics.  5. Return to clinic as needed.     Edrick Kins, DPM [facility]  Dr. Edrick Kins, DPM    [address], [place] [address]                Office [phone]  Fax [phone]

## 2020-03-09 ENCOUNTER — Ambulatory Visit: Payer: 59 | Admitting: Orthotics

## 2020-03-09 ENCOUNTER — Other Ambulatory Visit: Payer: Self-pay

## 2020-03-09 DIAGNOSIS — M722 Plantar fascial fibromatosis: Secondary | ICD-10-CM

## 2020-03-09 NOTE — Progress Notes (Signed)

## 2020-04-02 ENCOUNTER — Encounter: Payer: 59 | Admitting: Orthotics

## 2020-04-22 ENCOUNTER — Other Ambulatory Visit: Payer: Self-pay

## 2020-04-22 ENCOUNTER — Ambulatory Visit: Payer: 59 | Admitting: Orthotics

## 2020-04-22 DIAGNOSIS — M722 Plantar fascial fibromatosis: Secondary | ICD-10-CM

## 2020-04-22 NOTE — Progress Notes (Signed)
Patient came in today to pick up custom made foot orthotics.  The goals were accomplished and the patient reported no dissatisfaction with said orthotics.  Patient was advised of breakin period and how to report any issues. 

## 2020-05-12 ENCOUNTER — Other Ambulatory Visit: Payer: Self-pay

## 2020-05-12 ENCOUNTER — Ambulatory Visit: Payer: 59 | Admitting: Orthotics

## 2020-05-12 DIAGNOSIS — M722 Plantar fascial fibromatosis: Secondary | ICD-10-CM

## 2020-05-12 NOTE — Progress Notes (Signed)
Need to remove met pad from f/o.

## 2020-06-02 ENCOUNTER — Other Ambulatory Visit: Payer: Self-pay

## 2020-06-02 ENCOUNTER — Ambulatory Visit: Payer: 59 | Admitting: Orthotics

## 2020-06-02 DIAGNOSIS — M722 Plantar fascial fibromatosis: Secondary | ICD-10-CM

## 2020-06-02 NOTE — Progress Notes (Signed)
Patient came in today to p/up functional foot orthotics.   The orthotics were assessed to both fit and function.  The F/O addressed the biomechanical issues/pathologies as intended, offering good longitudinal arch support, proper offloading, and foot support. There weren't any signs of discomfort or irritation.  The F/O fit properly in footwear with minimal trimming/adjustments. 

## 2020-12-30 DIAGNOSIS — H5213 Myopia, bilateral: Secondary | ICD-10-CM | POA: Diagnosis not present

## 2020-12-30 DIAGNOSIS — H524 Presbyopia: Secondary | ICD-10-CM | POA: Diagnosis not present

## 2021-01-27 ENCOUNTER — Ambulatory Visit (INDEPENDENT_AMBULATORY_CARE_PROVIDER_SITE_OTHER): Payer: 59 | Admitting: Otolaryngology

## 2021-01-27 ENCOUNTER — Other Ambulatory Visit: Payer: Self-pay

## 2021-01-27 DIAGNOSIS — H6123 Impacted cerumen, bilateral: Secondary | ICD-10-CM | POA: Diagnosis not present

## 2021-01-27 DIAGNOSIS — H9041 Sensorineural hearing loss, unilateral, right ear, with unrestricted hearing on the contralateral side: Secondary | ICD-10-CM | POA: Diagnosis not present

## 2021-01-27 NOTE — Progress Notes (Signed)
HPI: [name] is a 55 y.o. female who presents for evaluation of wax buildup in her ears.  She has to use a stethoscope and this is when she seems to have more problems using the stethoscope.  She used to be seen by Dr.Crossley.  She has had a longstanding hearing loss in the right ear but has not had any recent audiograms.  She presents today to have annual cleaning of her ears..  Past Medical History:  Diagnosis Date  . Arthritis   . Family history of adverse reaction to anesthesia    mom has ponv  . GERD (gastroesophageal reflux disease)    Past Surgical History:  Procedure Laterality Date  . LIPOMA EXCISION Left 08/17/2016   Procedure: EXCISION OF SUBCUTANEOUS LIPOMA LEFT HIP;  Surgeon: Donnie Mesa, MD;  Location: [facility];  Service: General;  Laterality: Left;   Social History   Socioeconomic History  . Marital status: Married    Spouse name: Not on file  . Number of children: Not on file  . Years of education: Not on file  . Highest education level: Not on file  Occupational History  . Not on file  Tobacco Use  . Smoking status: Never Smoker  . Smokeless tobacco: Never Used  Substance and Sexual Activity  . Alcohol use: Yes  . Drug use: No  . Sexual activity: Not on file  Other Topics Concern  . Not on file  Social History Narrative  . Not on file   Social Determinants of Health   Financial Resource Strain: Not on file  Food Insecurity: Not on file  Transportation Needs: Not on file  Physical Activity: Not on file  Stress: Not on file  Social Connections: Not on file   Family History  Problem Relation Age of Onset  . Breast cancer Mother    Allergies  Allergen Reactions  . Compazine [Prochlorperazine Edisylate] Other (See Comments)    angioedema  . Flexeril [Cyclobenzaprine] Other (See Comments)    Severe confusion  . Reglan [Metoclopramide] Other (See Comments)    Angioedema    Prior to Admission medications   Medication  Sig Start Date End Date Taking? Authorizing Provider  acetaminophen (TYLENOL) 500 MG tablet Take 500 mg by mouth every 6 (six) hours as needed.    [provider]  Diclofenac Sodium (PENNSAID) 2 % SOLN Pennsaid 20 mg/gram/actuation (2 %) topical soln in metered-dose pump  apply ONE PUMP TWICE DAILY    [provider]  HYDROcodone-acetaminophen (NORCO/VICODIN) 5-325 MG tablet Take 1-2 tablets by mouth every 6 (six) hours as needed for moderate pain. 08/17/16   Donnie Mesa, MD  ibuprofen (ADVIL,MOTRIN) 600 MG tablet Take 1 tablet (600 mg total) by mouth every 8 (eight) hours as needed. 09/05/18   Edrick Kins, DPM  methylPREDNISolone (MEDROL DOSEPAK) 4 MG TBPK tablet 6 day dose pack - take as directed 02/26/20   Edrick Kins, DPM  naproxen sodium (ANAPROX) 220 MG tablet Take 220 mg by mouth 2 (two) times daily with a meal.    [provider]  omeprazole (PRILOSEC) 40 MG capsule Take 40 mg by mouth daily.    [provider]     Positive ROS: Otherwise negative  All other systems have been reviewed and were otherwise negative with the exception of those mentioned in the HPI and as above.  Physical Exam: Constitutional: Alert, well-appearing, no acute distress Ears: External ears without lesions or tenderness. Ear canals with a  moderate amount of wax in both ear canals that was pushed deep within the ear canal secondary to stethoscope use.  This was removed with forceps and curettes.  TMs are clear bilaterally.  On tuning fork testing she has a mild right ear SNHL with Weber lateralized to the left side.. Nasal: External nose without lesions. Clear nasal passages Oral: Oropharynx clear. Neck: No palpable adenopathy or masses Respiratory: Breathing comfortably  Skin: No facial/neck lesions or rash noted.  Cerumen impaction removal  Date/Time: 01/27/2021 10:34 AM Performed by: Rozetta Nunnery, MD Authorized by: Rozetta Nunnery, MD   Consent:     Consent obtained:  Verbal   Consent given by:  Patient   Risks discussed:  Pain and bleeding Procedure details:    Location:  L ear and R ear   Procedure type: curette and forceps   Post-procedure details:    Inspection:  TM intact and canal normal   Hearing quality:  Improved   Patient tolerance of procedure:  Tolerated well, no immediate complications Comments:     TMs are clear bilaterally.    Assessment: Wax buildup in both ear canals Right ear sensorineural hearing loss.  Plan: Discussed with her concerning follow-up in 1 year for the check of her ears and cleaning the ear canals.  At that time would recommend obtaining audiologic testing to evaluate hearing loss although she could get a hearing test performed earlier and she will schedule this on her own.  She would be a candidate probably for a hearing aid in the right ear.  Radene Journey, MD

## 2021-03-31 ENCOUNTER — Other Ambulatory Visit (HOSPITAL_COMMUNITY): Payer: Self-pay

## 2021-03-31 MED ORDER — CARESTART COVID-19 HOME TEST VI KIT
PACK | 0 refills | Status: DC
  Filled 2021-03-31: qty 4, 4d supply, fill #0

## 2021-04-13 NOTE — Progress Notes (Signed)
  Subjective:    [name] - 55 y.o. female MRN [id number]  Date of birth: December 28, 1965  HPI  [name] is to establish care. Patient has a PMH significant for acute pain of left hip.   Current issues and/or concerns: Reports concerns for feeling bloated with gas on a consistent basis. Does not matter what she eats or drinks. Denies blood in the stool.    ROS per HPI    Health Maintenance:  Health Maintenance Due  Topic Date Due   HIV Screening  Never done   Hepatitis C Screening  Never done   TETANUS/TDAP  Never done   PAP SMEAR-Modifier  Never done   COLONOSCOPY (Pts 45-59yrs Insurance coverage will need to be confirmed)  Never done   Zoster Vaccines- Shingrix (1 of 2) Never done    Past Medical History: Patient Active Problem List   Diagnosis Date Noted   Acute pain of left hip 09/07/2016    Social History   reports that she has never smoked. She has never used smokeless tobacco. She reports current alcohol use. She reports that she does not use drugs.   Family History  family history includes Breast cancer in her mother.   Medications: reviewed and updated   Objective:   Physical Exam BP 131/84 (BP Location: Left Arm, Patient Position: Sitting, Cuff Size: Normal)   Pulse (!) 56   Temp 98.1 F (36.7 C)   Resp 15   Ht 5' 8.11" (1.73 m)   Wt 167 lb 9.6 oz (76 kg)   SpO2 99%   BMI 25.40 kg/m  Physical Exam  General appearance - alert, well appearing, and in no distress and oriented to person, place, and time Mental status - alert, oriented to person, place, and time, normal mood, behavior, speech, dress, motor activity, and thought processes Chest - clear to auscultation, no wheezes, rales or rhonchi, symmetric air entry, no tachypnea, retractions or cyanosis Heart - normal rate, regular rhythm, normal S1, S2, no murmurs, rubs, clicks or gallops Neurological - alert, oriented, normal speech, no focal findings or movement disorder noted      Assessment & Plan:  1. Encounter to establish care: - Patient presents today to establish care.  - Return for annual physical examination, labs, and health maintenance. Arrive fasting meaning having no food for at least 8 hours prior to appointment. You may have only water or black coffee. Please take scheduled medications as normal.  2. Colon cancer screening: - Referral to Gastroenterology for colon cancer screening by colonoscopy. - Ambulatory referral to Gastroenterology    Patient was given clear instructions to go to Emergency Department or return to medical center if symptoms don't improve, worsen, or new problems develop.The patient verbalized understanding.  I discussed the assessment and treatment plan with the patient. The patient was provided an opportunity to ask questions and all were answered. The patient agreed with the plan and demonstrated an understanding of the instructions.   The patient was advised to call back or seek an in-person evaluation if the symptoms worsen or if the condition fails to improve as anticipated.    Durene Fruits, NP 04/15/2021, 10:11 PM Primary Care at [facility]

## 2021-04-14 ENCOUNTER — Encounter: Payer: Self-pay | Admitting: Family

## 2021-04-14 ENCOUNTER — Ambulatory Visit: Payer: 59 | Admitting: Family

## 2021-04-14 ENCOUNTER — Other Ambulatory Visit: Payer: Self-pay

## 2021-04-14 VITALS — BP 131/84 | HR 56 | Temp 98.1°F | Resp 15 | Ht 68.11 in | Wt 167.6 lb

## 2021-04-14 DIAGNOSIS — Z7689 Persons encountering health services in other specified circumstances: Secondary | ICD-10-CM

## 2021-04-14 DIAGNOSIS — Z1211 Encounter for screening for malignant neoplasm of colon: Secondary | ICD-10-CM | POA: Diagnosis not present

## 2021-04-14 NOTE — Progress Notes (Signed)
Pt presents to establish care  Experiencing stomach bloating and gas

## 2021-04-19 ENCOUNTER — Other Ambulatory Visit: Payer: Self-pay | Admitting: Family

## 2021-04-19 DIAGNOSIS — Z1231 Encounter for screening mammogram for malignant neoplasm of breast: Secondary | ICD-10-CM

## 2021-04-21 ENCOUNTER — Other Ambulatory Visit: Payer: Self-pay

## 2021-04-21 ENCOUNTER — Ambulatory Visit
Admission: RE | Admit: 2021-04-21 | Discharge: 2021-04-21 | Disposition: A | Discharge status: Home / Self Care | Payer: 59 | Source: Ambulatory Visit | Attending: Family | Admitting: Family

## 2021-04-21 DIAGNOSIS — Z1231 Encounter for screening mammogram for malignant neoplasm of breast: Secondary | ICD-10-CM

## 2021-04-22 DIAGNOSIS — K219 Gastro-esophageal reflux disease without esophagitis: Secondary | ICD-10-CM | POA: Diagnosis not present

## 2021-04-22 DIAGNOSIS — K5904 Chronic idiopathic constipation: Secondary | ICD-10-CM | POA: Diagnosis not present

## 2021-04-22 DIAGNOSIS — R140 Abdominal distension (gaseous): Secondary | ICD-10-CM | POA: Diagnosis not present

## 2021-04-22 DIAGNOSIS — Z1211 Encounter for screening for malignant neoplasm of colon: Secondary | ICD-10-CM | POA: Diagnosis not present

## 2021-04-23 ENCOUNTER — Other Ambulatory Visit: Payer: Self-pay | Admitting: Family

## 2021-04-23 ENCOUNTER — Telehealth: Payer: Self-pay | Admitting: Family

## 2021-04-23 ENCOUNTER — Other Ambulatory Visit (HOSPITAL_COMMUNITY): Payer: Self-pay

## 2021-04-23 DIAGNOSIS — R928 Other abnormal and inconclusive findings on diagnostic imaging of breast: Secondary | ICD-10-CM

## 2021-04-23 MED ORDER — PANTOPRAZOLE SODIUM 40 MG PO TBEC
40.0000 mg | DELAYED_RELEASE_TABLET | Freq: Every day | ORAL | 4 refills | Status: DC
  Filled 2021-04-23: qty 90, 90d supply, fill #0
  Filled 2021-07-02 – 2021-07-14 (×2): qty 90, 90d supply, fill #1
  Filled 2021-09-24 (×2): qty 90, 90d supply, fill #2
  Filled 2022-02-08: qty 90, 90d supply, fill #3

## 2021-04-23 NOTE — Telephone Encounter (Signed)
Patient called asking for her results from the [facility]. Patient stated she spoke with the office and they could not give her the results and to call her PCP for them. Patient stated she is worried because all she was told is that she needs to make an appointment for an [place] of her left breast. Please call patient and advise.

## 2021-04-26 ENCOUNTER — Other Ambulatory Visit: Payer: Self-pay | Admitting: Family

## 2021-04-26 DIAGNOSIS — Z1239 Encounter for other screening for malignant neoplasm of breast: Secondary | ICD-10-CM

## 2021-04-26 NOTE — Telephone Encounter (Signed)
Mammogram from 04/21/2021 shows left breast, with a possible mass warrants further evaluation. An order for ultrasound of left breast ordered. Patient should receive a call from their office within 2 weeks.   In the right breast, no findings suspicious for malignancy.

## 2021-04-28 NOTE — Telephone Encounter (Signed)
Pt contacted about mammogram aware of mammogram results/breast ultrasound scheduled 7/14

## 2021-05-13 ENCOUNTER — Other Ambulatory Visit: Payer: Self-pay

## 2021-05-13 ENCOUNTER — Ambulatory Visit
Admission: RE | Admit: 2021-05-13 | Discharge: 2021-05-13 | Disposition: A | Discharge status: Home / Self Care | Payer: 59 | Source: Ambulatory Visit | Attending: Family | Admitting: Family

## 2021-05-13 DIAGNOSIS — R928 Other abnormal and inconclusive findings on diagnostic imaging of breast: Secondary | ICD-10-CM

## 2021-05-13 DIAGNOSIS — N6002 Solitary cyst of left breast: Secondary | ICD-10-CM | POA: Diagnosis not present

## 2021-05-13 NOTE — Progress Notes (Signed)
Please call patient with update.   Fibrocystic changes. No evidence of malignancy.   Repeat mammogram in 1 year.

## 2021-05-13 NOTE — Progress Notes (Signed)
Fibrocystic changes. No evidence of malignancy.   Repeat mammogram in 1 year.

## 2021-05-19 ENCOUNTER — Ambulatory Visit (INDEPENDENT_AMBULATORY_CARE_PROVIDER_SITE_OTHER): Payer: 59

## 2021-05-19 ENCOUNTER — Ambulatory Visit (INDEPENDENT_AMBULATORY_CARE_PROVIDER_SITE_OTHER): Payer: 59 | Admitting: Family

## 2021-05-19 ENCOUNTER — Other Ambulatory Visit: Payer: Self-pay

## 2021-05-19 ENCOUNTER — Encounter: Payer: Self-pay | Admitting: Family

## 2021-05-19 VITALS — BP 149/73 | HR 49 | Temp 98.1°F | Resp 16 | Ht 68.11 in | Wt 167.0 lb

## 2021-05-19 DIAGNOSIS — Z13228 Encounter for screening for other metabolic disorders: Secondary | ICD-10-CM

## 2021-05-19 DIAGNOSIS — R030 Elevated blood-pressure reading, without diagnosis of hypertension: Secondary | ICD-10-CM

## 2021-05-19 DIAGNOSIS — Z1211 Encounter for screening for malignant neoplasm of colon: Secondary | ICD-10-CM

## 2021-05-19 DIAGNOSIS — Z1159 Encounter for screening for other viral diseases: Secondary | ICD-10-CM | POA: Diagnosis not present

## 2021-05-19 DIAGNOSIS — Z0000 Encounter for general adult medical examination without abnormal findings: Secondary | ICD-10-CM

## 2021-05-19 DIAGNOSIS — Z136 Encounter for screening for cardiovascular disorders: Secondary | ICD-10-CM

## 2021-05-19 DIAGNOSIS — Z23 Encounter for immunization: Secondary | ICD-10-CM | POA: Diagnosis not present

## 2021-05-19 DIAGNOSIS — Z114 Encounter for screening for human immunodeficiency virus [HIV]: Secondary | ICD-10-CM | POA: Diagnosis not present

## 2021-05-19 DIAGNOSIS — G8929 Other chronic pain: Secondary | ICD-10-CM

## 2021-05-19 DIAGNOSIS — M545 Low back pain, unspecified: Secondary | ICD-10-CM

## 2021-05-19 DIAGNOSIS — Z13220 Encounter for screening for lipoid disorders: Secondary | ICD-10-CM

## 2021-05-19 DIAGNOSIS — Z01419 Encounter for gynecological examination (general) (routine) without abnormal findings: Secondary | ICD-10-CM | POA: Diagnosis not present

## 2021-05-19 DIAGNOSIS — Z113 Encounter for screening for infections with a predominantly sexual mode of transmission: Secondary | ICD-10-CM

## 2021-05-19 DIAGNOSIS — M069 Rheumatoid arthritis, unspecified: Secondary | ICD-10-CM | POA: Diagnosis not present

## 2021-05-19 DIAGNOSIS — Z131 Encounter for screening for diabetes mellitus: Secondary | ICD-10-CM

## 2021-05-19 DIAGNOSIS — Z1321 Encounter for screening for nutritional disorder: Secondary | ICD-10-CM | POA: Diagnosis not present

## 2021-05-19 DIAGNOSIS — Z124 Encounter for screening for malignant neoplasm of cervix: Secondary | ICD-10-CM

## 2021-05-19 DIAGNOSIS — Z1329 Encounter for screening for other suspected endocrine disorder: Secondary | ICD-10-CM

## 2021-05-19 DIAGNOSIS — Z130 Encounter for screening for diseases of the blood and blood-forming organs and certain disorders involving the immune mechanism: Secondary | ICD-10-CM

## 2021-05-19 DIAGNOSIS — Z780 Asymptomatic menopausal state: Secondary | ICD-10-CM | POA: Diagnosis not present

## 2021-05-19 DIAGNOSIS — Z13820 Encounter for screening for osteoporosis: Secondary | ICD-10-CM

## 2021-05-19 NOTE — Progress Notes (Signed)
Pt presents for annual physical w/o pap pt want arthritis panel and Vit D checked

## 2021-05-19 NOTE — Progress Notes (Signed)
Patient ID: [name], female    DOB: 07/19/1966  MRN: [id number]  CC: Annual Physical Exam  Subjective: [name] is a 55 y.o. female who presents for annual physical exam.   Her concerns today include:   Had PAP smear earlier today with Servando Salina, MD.   Colonoscopy scheduled 06/04/2021.   Appointment recently at Gastroenterology and the following labs were obtained BMP, CBC, TSH, and hepatic function panel. Reports all labs were normal and no need to repeat the same today.   Would like bone density screening. Denies any signs related to osteoporosis, osteopenia, bone pain, fractures, and no family history. Reports she is aware that insurance may not cover bone density but would like to at least give a try for approval.  Lower midline back pain ongoing for years. Wearing a back brace and using heat as needed. Hears popping and clicking intermittently. Pain on average 4-5/10. Taking Tylenol and Ibuprofen as needed. Endorses arthritis flares in knees and hands.  Would like testing for possible heart condition. Denies chest pain, chest pressure, shortness of breath, nausea, and vomiting.   Would like testing for possible rheumatoid arthritis.   Would like vitamin D deficiency screening.   Requesting Shingles vaccine and Tdap vaccine.  Reports she is unsure why her blood pressure is higher on today. Usually 120's/70's-80's. Says she feels well.  Patient Active Problem List   Diagnosis Date Noted   Acute pain of left hip 09/07/2016     Current Outpatient Medications on File Prior to Visit  Medication Sig Dispense Refill   acetaminophen (TYLENOL) 500 MG tablet Take 500 mg by mouth every 6 (six) hours as needed.     COVID-19 At Home Antigen Test [facility] COVID-19 HOME TEST) KIT use as directed 4 each 0   Diclofenac Sodium (PENNSAID) 2 % SOLN Pennsaid 20 mg/gram/actuation (2 %) topical soln in metered-dose pump  apply ONE PUMP TWICE DAILY      HYDROcodone-acetaminophen (NORCO/VICODIN) 5-325 MG tablet Take 1-2 tablets by mouth every 6 (six) hours as needed for moderate pain. 30 tablet 0   ibuprofen (ADVIL,MOTRIN) 600 MG tablet Take 1 tablet (600 mg total) by mouth every 8 (eight) hours as needed. 90 tablet 1   methylPREDNISolone (MEDROL DOSEPAK) 4 MG TBPK tablet 6 day dose pack - take as directed 21 tablet 0   naproxen sodium (ANAPROX) 220 MG tablet Take 220 mg by mouth 2 (two) times daily with a meal.     omeprazole (PRILOSEC) 40 MG capsule Take 40 mg by mouth daily.     pantoprazole (PROTONIX) 40 MG tablet Take 1 tablet (40 mg total) by mouth daily. 90 tablet 4   No current facility-administered medications on file prior to visit.    Allergies  Allergen Reactions   Compazine [Prochlorperazine Edisylate] Other (See Comments)    angioedema   Flexeril [Cyclobenzaprine] Other (See Comments)    Severe confusion   Prochlorperazine Other (See Comments)   Reglan [Metoclopramide] Other (See Comments)    Angioedema     Social History   Socioeconomic History   Marital status: Married    Spouse name: Not on file   Number of children: Not on file   Years of education: Not on file   Highest education level: Not on file  Occupational History   Not on file  Tobacco Use   Smoking status: Never   Smokeless tobacco: Never  Substance and Sexual Activity   Alcohol use: Yes   Drug use:  No   Sexual activity: Not on file  Other Topics Concern   Not on file  Social History Narrative   Not on file   Social Determinants of Health   Financial Resource Strain: Not on file  Food Insecurity: Not on file  Transportation Needs: Not on file  Physical Activity: Not on file  Stress: Not on file  Social Connections: Not on file  Intimate Partner Violence: Not on file    Family History  Problem Relation Age of Onset   Breast cancer Mother     Past Surgical History:  Procedure Laterality Date   LIPOMA EXCISION Left 08/17/2016    Procedure: EXCISION OF SUBCUTANEOUS LIPOMA LEFT HIP;  Surgeon: Donnie Mesa, MD;  Location: [place];  Service: General;  Laterality: Left;    ROS: Review of Systems Negative except as stated above  PHYSICAL EXAM: Temp 98.1 F (36.7 C)   Resp 16   Ht 5' 8.11" (1.73 m)   Wt 167 lb (75.8 kg)   BMI 25.31 kg/m   Physical Exam HENT:     Head: Normocephalic and atraumatic.     Right Ear: Tympanic membrane, ear canal and external ear normal.     Left Ear: Tympanic membrane, ear canal and external ear normal.     Nose: Nose normal.     Mouth/Throat:     Mouth: Mucous membranes are moist.     Pharynx: Oropharynx is clear.  Eyes:     Extraocular Movements: Extraocular movements intact.     Conjunctiva/sclera: Conjunctivae normal.     Pupils: Pupils are equal, round, and reactive to light.  Cardiovascular:     Rate and Rhythm: Bradycardia present.     Pulses: Normal pulses.     Heart sounds: Normal heart sounds.  Pulmonary:     Effort: Pulmonary effort is normal.     Breath sounds: Normal breath sounds.  Chest:     Comments: Patient declined exam. Abdominal:     General: Bowel sounds are normal.     Palpations: Abdomen is soft.  Genitourinary:    Comments: Patient declined exam.  Musculoskeletal:        General: Normal range of motion.     Cervical back: Normal range of motion and neck supple.  Skin:    General: Skin is warm and dry.     Capillary Refill: Capillary refill takes less than 2 seconds.  Neurological:     General: No focal deficit present.     Mental Status: She is alert and oriented to person, place, and time.  Psychiatric:        Mood and Affect: Mood normal.        Behavior: Behavior normal.    ASSESSMENT AND PLAN: 1. Annual physical exam: - Counseled on 150 minutes of exercise per week as tolerated, healthy eating (including decreased daily intake of saturated fats, cholesterol, added sugars, sodium), STI prevention, and routine  healthcare maintenance.  2. Screening for metabolic disorder: - Patient declined, reports recently completed at Gastroenterology and was normal at that time.   3. Screening for deficiency anemia: - Patient declined, reports recently completed at Gastroenterology and was normal at that time.   4. Diabetes mellitus screening: - Hemoglobin A1c to screen for pre-diabetes/diabetes. - Hemoglobin A1c  5. Screening cholesterol level: - Lipid panel to screen for high cholesterol.  - Lipid panel  6. Thyroid disorder screen: - Patient declined, reports recently completed at Gastroenterology and was normal at that time.  7. Need for hepatitis C screening test: - Hepatitis C antibody to screen for hepatitis C.  - Hepatitis C Antibody  8. Encounter for vitamin deficiency screening: - Vitamin D, 25-hydroxy to screen for deficiency. - Vitamin D, 25-hydroxy  9. Screening for heart disease: - Screening for heart failure per patient request.  - Brain natriuretic peptide  10. Rheumatoid arthritis, involving unspecified site, unspecified whether rheumatoid factor present [facility]): - Screening for rheumatoid arthritis per patient request.  - Rheumatoid factor - ANA - Sedimentation Rate - C-reactive protein  11. Pap smear for cervical cancer screening: 12. Routine screening for STI (sexually transmitted infection): - Patient declined. Completed today with Servando Salina, MD.  13. Encounter for screening for HIV: - HIV antibody to screen for human immunodeficiency virus.  - HIV antibody (with reflex)  14. Colon cancer screening: - Patient reports scheduled on 06/04/2021.  15. Osteoporosis screening: - Per patient request bone density for osteoporosis screening.  - DG Bone Density; Future  16. Chronic midline low back pain, unspecified whether sciatica present: - Diagnostic lumbar spine for further evaluation.  - DG Lumbar Spine Complete; Future  17. Elevated blood pressure reading  without diagnosis of hypertension: - Blood pressure not at goal during today's visit. Patient asymptomatic without chest pressure, chest pain, palpitations, shortness of breath, and worst headache of life. - Patient reports home blood pressures are normal 120's/70's-80's. - Counseled patient to continue to monitor at home. Should blood pressures consistently stay in 140's/80's-90's she should follow-up within 2 weeks for further evaluation and management. Patient agreeable and verbalized understanding.  18. Need for shingles vaccine: - Administered today in office.  - Varicella-zoster vaccine IM  19. Need for diphtheria-tetanus-pertussis (Tdap) vaccine: - Administered today in office.  - Tdap vaccine greater than or equal to 7yo IM   Patient was given the opportunity to ask questions.  Patient verbalized understanding of the plan and was able to repeat key elements of the plan. Patient was given clear instructions to go to Emergency Department or return to medical center if symptoms don't improve, worsen, or new problems develop.The patient verbalized understanding.   Follow-up with primary provider as scheduled.   Camillia Herter, NP

## 2021-05-20 ENCOUNTER — Other Ambulatory Visit (HOSPITAL_COMMUNITY): Payer: Self-pay

## 2021-05-20 ENCOUNTER — Encounter: Payer: Self-pay | Admitting: Family

## 2021-05-20 DIAGNOSIS — R7303 Prediabetes: Secondary | ICD-10-CM | POA: Insufficient documentation

## 2021-05-20 LAB — LIPID PANEL
Chol/HDL Ratio: 2.2 ratio (ref 0.0–4.4)
Cholesterol, Total: 172 mg/dL (ref 100–199)
HDL: 79 mg/dL (ref >39)
LDL Chol Calc (NIH): 84 mg/dL (ref 0–99)
Triglycerides: 45 mg/dL (ref 0–149)
VLDL Cholesterol Cal: 9 mg/dL (ref 5–40)

## 2021-05-20 LAB — VITAMIN D 25 HYDROXY (VIT D DEFICIENCY, FRACTURES): Vit D, 25-Hydroxy: 43 ng/mL (ref 30.0–100.0)

## 2021-05-20 LAB — HEMOGLOBIN A1C
Est. average glucose Bld gHb Est-mCnc: 117 mg/dL
Hgb A1c MFr Bld: 5.7 % — ABNORMAL HIGH (ref 4.8–5.6)

## 2021-05-20 LAB — ANA: Anti Nuclear Antibody (ANA): NEGATIVE

## 2021-05-20 LAB — RHEUMATOID FACTOR: Rheumatoid fact SerPl-aCnc: <10 IU/mL (ref <14.0)

## 2021-05-20 LAB — HIV ANTIBODY (ROUTINE TESTING W REFLEX): HIV Screen 4th Generation wRfx: NONREACTIVE

## 2021-05-20 LAB — SEDIMENTATION RATE: Sed Rate: 2 mm/hr (ref 0–40)

## 2021-05-20 LAB — BRAIN NATRIURETIC PEPTIDE

## 2021-05-20 LAB — HEPATITIS C ANTIBODY: Hep C Virus Ab: <0.1 s/co ratio (ref 0.0–0.9)

## 2021-05-20 LAB — C-REACTIVE PROTEIN: CRP: 1 mg/L (ref 0–10)

## 2021-05-20 MED ORDER — CLENPIQ 10-3.5-12 MG-GM -GM/160ML PO SOLN
ORAL | 0 refills | Status: DC
  Filled 2021-05-20: qty 320, 1d supply, fill #0

## 2021-05-20 NOTE — Progress Notes (Signed)
Cholesterol normal.   Vitamin D normal.   Rheumatoid factor normal.   ANA normal.   Sedimentation rate normal.   C-reactive protein normal.   Hepatitis C negative.   HIV negative.   Per [facility] BNP was canceled. Patient welcome to call our office to schedule lab only visit to have this recollected at her best convenience.   Hemoglobin A1c is consistent with pre-diabetes. Practice healthy eating habits of fresh fruit and vegetables, lean baked meats such as chicken, fish, and [place]; limit breads, rice, pastas, and desserts; practice regular aerobic exercise (at least 150 minutes a week as tolerated). No medication needed at the moment. Patient encouraged to have rechecked in 6 months.

## 2021-05-21 ENCOUNTER — Other Ambulatory Visit: Payer: Self-pay | Admitting: Family

## 2021-05-21 DIAGNOSIS — M47816 Spondylosis without myelopathy or radiculopathy, lumbar region: Secondary | ICD-10-CM

## 2021-05-21 NOTE — Progress Notes (Signed)
Degenerative changes of lumbar spine suggesting osteoarthritis. Referral to Orthopedics for further evaluation and management. Their office should call patient within 2 weeks with appointment details.   Left uterine fibroid evident. This is not new onset and noted in patient's chart since at least 2012. Would encourage patient to keep appointments with consulting gynecologist Servando Salina, MD.

## 2021-05-24 ENCOUNTER — Ambulatory Visit: Payer: 59 | Admitting: Family Medicine

## 2021-05-24 ENCOUNTER — Other Ambulatory Visit: Payer: Self-pay

## 2021-05-24 ENCOUNTER — Encounter: Payer: Self-pay | Admitting: Family Medicine

## 2021-05-24 DIAGNOSIS — Z780 Asymptomatic menopausal state: Secondary | ICD-10-CM | POA: Diagnosis not present

## 2021-05-24 DIAGNOSIS — Z8269 Family history of other diseases of the musculoskeletal system and connective tissue: Secondary | ICD-10-CM

## 2021-05-24 DIAGNOSIS — M47816 Spondylosis without myelopathy or radiculopathy, lumbar region: Secondary | ICD-10-CM

## 2021-05-24 MED ORDER — BACLOFEN 10 MG PO TABS: 5.0000 mg | ORAL_TABLET | Freq: Every evening | ORAL | 3 refills | Status: DC | PRN

## 2021-05-24 NOTE — Progress Notes (Signed)
Office Visit Note   Patient: [name]           Date of Birth: 07-20-1966           MRN: [id number] Visit Date: 05/24/2021 Requested by: Camillia Herter, NP [place],  [place] [address] PCP: No primary care provider on file.  Subjective: Chief Complaint  Patient presents with   Lower Back - Pain    Intermittent low back pain x years, but worse lately. Hurts mainly with bending/stooping motions.     HPI: She is here with low back pain.  Symptoms started years ago, no injury.  Pain was intermittent at first but now it has become more constant.  Pain is definitely worse when standing up for long periods, or when standing up after bending forward.  She has to wear a corset brace for support.  No radicular pain.  She tries Tylenol and over-the-counter NSAIDs occasionally.  She does not take anything on a regular basis.  She feels better when sleeping on her side.  She had x-rays recently and now presents for evaluation.  She works as a Marine scientist at [employer].  She has been diagnosed with prediabetes.  She admits to eating sweets on a regular basis.  She has a family history of gout in her mother.  She was recently placed on Protonix for acid reflux symptoms.                ROS:   All other systems were reviewed and are negative.  Objective: Vital Signs: There were no vitals taken for this visit.  Physical Exam:  General:  Alert and oriented, in no acute distress. Pulm:  Breathing unlabored. Psy:  Normal mood, congruent affect. Skin: No rash Low back: She is tender to palpation near the L4 spinous process to the right and left of midline.  No pain over the SI joints or in the sciatic notch.  Stork test is equivocal, straight leg raise is negative.  No pain with internal/external hip rotation and her range of motion is good.  5/5 lower extremity strength and 2+ knee and ankle DTRs.    Imaging: Previous x-rays viewed on computer show mild L4-5 and L5-S1  degenerative disc disease with moderate facet arthropathy at multiple levels.  There is a calcified uterine fibroid in the pelvis.    Assessment & Plan: Chronic low back pain, suspect due to facet arthropathy. -Physical therapy referral, glucosamine and turmeric.  Baclofen as needed.  Trial of a food elimination diet. -Recommended avoidance of sugar is much as possible. -MRI if not improving with conservative treatment.  Bone density test ordered.     Procedures: No procedures performed        PMFS History: Patient Active Problem List   Diagnosis Date Noted   Prediabetes 05/20/2021   Acute pain of left hip 09/07/2016   Past Medical History:  Diagnosis Date   Arthritis    Family history of adverse reaction to anesthesia    mom has ponv   GERD (gastroesophageal reflux disease)     Family History  Problem Relation Age of Onset   Breast cancer Mother     Past Surgical History:  Procedure Laterality Date   LIPOMA EXCISION Left 08/17/2016   Procedure: EXCISION OF SUBCUTANEOUS LIPOMA LEFT HIP;  Surgeon: Donnie Mesa, MD;  Location: [facility];  Service: General;  Laterality: Left;   Social History   Occupational History   Not  on file  Tobacco Use   Smoking status: Never   Smokeless tobacco: Never  Substance and Sexual Activity   Alcohol use: Yes   Drug use: No   Sexual activity: Not on file

## 2021-06-02 ENCOUNTER — Other Ambulatory Visit: Payer: Self-pay

## 2021-06-02 ENCOUNTER — Ambulatory Visit
Admission: RE | Admit: 2021-06-02 | Discharge: 2021-06-02 | Disposition: A | Discharge status: Home / Self Care | Payer: 59 | Source: Ambulatory Visit | Attending: Family | Admitting: Family

## 2021-06-02 DIAGNOSIS — Z13820 Encounter for screening for osteoporosis: Secondary | ICD-10-CM

## 2021-06-02 DIAGNOSIS — Z780 Asymptomatic menopausal state: Secondary | ICD-10-CM | POA: Diagnosis not present

## 2021-06-02 DIAGNOSIS — Z0000 Encounter for general adult medical examination without abnormal findings: Secondary | ICD-10-CM | POA: Diagnosis not present

## 2021-06-04 DIAGNOSIS — Z1211 Encounter for screening for malignant neoplasm of colon: Secondary | ICD-10-CM | POA: Diagnosis not present

## 2021-06-04 DIAGNOSIS — K219 Gastro-esophageal reflux disease without esophagitis: Secondary | ICD-10-CM | POA: Diagnosis not present

## 2021-06-04 DIAGNOSIS — K2970 Gastritis, unspecified, without bleeding: Secondary | ICD-10-CM | POA: Diagnosis not present

## 2021-06-04 LAB — HM COLONOSCOPY

## 2021-06-09 ENCOUNTER — Other Ambulatory Visit: Payer: Self-pay

## 2021-06-09 ENCOUNTER — Encounter: Payer: Self-pay | Admitting: Physical Therapy

## 2021-06-09 ENCOUNTER — Ambulatory Visit: Payer: 59 | Admitting: Physical Therapy

## 2021-06-09 ENCOUNTER — Other Ambulatory Visit: Payer: Self-pay | Admitting: Gastroenterology

## 2021-06-09 DIAGNOSIS — R262 Difficulty in walking, not elsewhere classified: Secondary | ICD-10-CM

## 2021-06-09 DIAGNOSIS — G8929 Other chronic pain: Secondary | ICD-10-CM

## 2021-06-09 DIAGNOSIS — M6281 Muscle weakness (generalized): Secondary | ICD-10-CM

## 2021-06-09 DIAGNOSIS — M545 Low back pain, unspecified: Secondary | ICD-10-CM

## 2021-06-09 NOTE — Therapy (Signed)
[facility] Physical Therapy [address] [place], [place], [address] Phone: [phone]   Fax:  [phone]  Physical Therapy Evaluation  Patient Details  Name: [name] MRN: [id number] Date of Birth: [birth year] Referring Provider (PT): Dr. Eunice Blase   Encounter Date: 06/09/2021   PT End of Session - 06/09/21 1127     Visit Number 1    Number of Visits 8    Date for PT Re-Evaluation 08/06/21    Authorization Type UMR    PT Start Time 0930    PT Stop Time 1015    PT Time Calculation (min) 45 min    Activity Tolerance Patient tolerated treatment well    Behavior During Therapy [facility] for tasks assessed/performed             Past Medical History:  Diagnosis Date   Arthritis    Family history of adverse reaction to anesthesia    mom has ponv   GERD (gastroesophageal reflux disease)     Past Surgical History:  Procedure Laterality Date   LIPOMA EXCISION Left 08/17/2016   Procedure: EXCISION OF SUBCUTANEOUS LIPOMA LEFT HIP;  Surgeon: Donnie Mesa, MD;  Location: [place];  Service: General;  Laterality: Left;    There were no vitals filed for this visit.    Subjective Assessment - 06/09/21 0932     Subjective Pt arriving today reporting long history of low back pain which began several years ago. Pt reporting that lately her pain is more constant and bending and lifting make the pain worse. Pt stating when she bends she has to walk up her legs with her hands to get back up. Pt stating Dr. Junius Roads prescribed her bacolofen, but she hasn't started it yet due to just recently have a colon cleanse.    Pertinent History prediabetic    Limitations Standing;Walking;House hold activities;Lifting   bending   Diagnostic tests X-ray    Patient Stated Goals Stop hurting, work without pain    Currently in Pain? Yes    Pain Score 5     Pain Location Back    Pain Orientation Lower    Pain Descriptors / Indicators Throbbing;Aching     Pain Type Chronic pain    Pain Onset More than a month ago    Pain Frequency Constant    Aggravating Factors  lifting, prolonged standing, bending    Pain Relieving Factors stretching, heat, over the counter pain meds rotating between tylenol and advil    Effect of Pain on Daily Activities difficulty working and with house hold chores                [facility] PT Assessment - 06/09/21 0001       Assessment   Medical Diagnosis m47.816 lumbar facet arthropathy    Referring Provider (PT) Dr. Legrand Como Hilts    Onset Date/Surgical Date --   years ago   Hand Dominance Right    Prior Therapy yes, not for back      Precautions   Precautions None      Restrictions   Weight Bearing Restrictions No      Balance Screen   Has the patient fallen in the past 6 months No    Is the patient reluctant to leave their home because of a fear of falling?  No      Home Ecologist residence    Living Arrangements Spouse/significant other    Type of [place]  Home Access Stairs to enter    Entrance Stairs-Number of Steps 3    Entrance Stairs-Rails None      Prior Function   Level of Independence Independent    Vocation Full time employment    Engineer, mining in ICU at [employer]   Overall Cognitive Status Within Functional Limits for tasks assessed      Observation/Other Assessments   Focus on Therapeutic Outcomes (FOTO)  63 (predicted 72)      ROM / Strength   AROM / PROM / Strength AROM;Strength      AROM   AROM Assessment Site Lumbar    Lumbar Flexion 80 degrees   coming back up was painful   Lumbar Extension 12 degrees   flexion from extension was painful   Lumbar - Right Side Bend 35 degrees   coming back up was painful   Lumbar - Left Side Bend 32 degrees      Strength   Strength Assessment Site Hip;Knee    Right/Left Hip Right;Left    Right Hip Flexion 4/5    Right Hip Extension 4/5    Right Hip  External Rotation  4+/5    Right Hip Internal Rotation 4+/5    Left Hip Flexion 4/5    Left Hip Extension 4/5    Left Hip External Rotation 4+/5    Left Hip Internal Rotation 4+/5    Right/Left Knee Right;Left    Right Knee Flexion 5/5    Right Knee Extension 5/5    Left Knee Flexion 5/5    Left Knee Extension 5/5      Palpation   Palpation comment TTP along lumbar sacral junction, TTP lumbar paraspinals and right QL      Special Tests    Special Tests Lumbar    Other special tests negative: slump and SLR, pt did however report pain when returning to neurtal position      Transfers   Comments sit to stand is slow the bilateral UE support      Ambulation/Gait   Gait Comments antalgic gait pattern with step through gait with slower controlled movements                        Objective measurements completed on examination: See above findings.               PT Education - 06/09/21 1124     Education Details PT POC, HEP    Person(s) Educated Patient    Methods Explanation;Demonstration;Handout;Verbal cues;Tactile cues    Comprehension Verbalized understanding;Returned demonstration              PT Short Term Goals - 06/09/21 1128       PT SHORT TERM GOAL #1   Title Pt will be independent in her initial HEP.    Time 2    Period Weeks    Status New    Target Date 06/25/21               PT Long Term Goals - 06/09/21 1128       PT LONG TERM GOAL #1   Title Pt will be independent in advaned HEP.    Time 8    Period Weeks    Status New    Target Date 08/06/21      PT LONG TERM GOAL #2   Title Pt will be able to lift  10# from floor to counter height with pain </= 2/10.    Time 8    Period Weeks    Status New    Target Date 08/06/21      PT LONG TERM GOAL #3   Title Pt will be able to increase her strength in hip flexion to 5/5.    Time 8    Period Weeks    Status New    Target Date 08/06/21      PT LONG TERM GOAL #4    Title Pt will be able to ambulate/stand for community mobility 30 minutes with pain </= 2/10.    Time 8    Period Weeks    Target Date 08/06/21      PT LONG TERM GOAL #5   Title Pt will improve her Foto score to >/= 72%.    Baseline 63% on 06/09/2021    Time 8    Period Weeks    Status New    Target Date 08/06/21                    Plan - 06/09/21 1138     Clinical Impression Statement Pt presenting today for evaluation of her low back pain. Pt stating pain was 5/10 upon arrival. Pt reproting increased pain when attempting to lift/bending and prolonged standing. Pt presenting with more pain when returning to neutral position following bending or rotational movements. Pt with limited response to lumbar exercises and pt was issued gentle stretching and standing extension. Pt with good response to long axis distraction of bilateral LE's and may responsd well to mechanaical traction at next visit. We discussed getting a sacroiliac belt to wear while at work for support. Skilled PT needed to address pt's impairments with the below interventions.    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   Stability/Clinical Decision Making Stable/Uncomplicated    Clinical Decision Making Low    Rehab Potential Good    PT Frequency 1x / week    PT Duration 8 weeks    PT Treatment/Interventions ADLs/Self Care Home Management;Electrical Stimulation;Iontophoresis '4mg'$ /ml Dexamethasone;Moist Heat;Traction;Ultrasound;Balance training;Therapeutic exercise;Therapeutic activities;Functional mobility training;Stair training;Gait training;Neuromuscular re-education;Patient/family education;Passive range of motion;Dry needling;Taping;Manual techniques;Spinal Manipulations;Joint Manipulations    PT Next Visit Plan consider lumbar traction, Nustep, lumbar stretching, extension,  lumbar/ SI mobs    PT Home Exercise Plan Access Code: [email or web address]  URL: [email or web address]  Date: 06/09/2021  Prepared by: Kearney Hard    Exercises  Supine Posterior Pelvic Tilt - 2 x daily - 7 x weekly - 10 reps - 5 seconds hold  Supine Lower Trunk Rotation - 2 x daily - 7 x weekly - 3 sets - 3-5 reps  Standing Thoracic Extension at Wall - 2 x daily - 7 x weekly - 10 reps  Hooklying Single Knee to Chest Stretch - 2 x daily - 7 x weekly - 3-5 reps    Consulted and Agree with Plan of Care Patient             Patient will benefit from skilled therapeutic intervention in order to improve the following deficits and impairments:     Visit Diagnosis: Chronic midline low back pain without sciatica  Difficulty in walking, not elsewhere classified  Muscle weakness (generalized)     Problem List Patient Active Problem List   Diagnosis Date Noted   Prediabetes 05/20/2021   Acute pain of  left hip 09/07/2016    Oretha Caprice, PT, MPT 06/09/2021, 12:00 PM  [facility] Physical Therapy [address] [place], [place], [address] Phone: [phone]   Fax:  [phone]  Name: [name] MRN: [id number] Date of Birth: 02/01/1966

## 2021-06-09 NOTE — Patient Instructions (Signed)
Access Code: [email or web address] URL: [email or web address] Date: 06/09/2021 Prepared by: Kearney Hard  Exercises Supine Posterior Pelvic Tilt - 2 x daily - 7 x weekly - 10 reps - 5 seconds hold Supine Lower Trunk Rotation - 2 x daily - 7 x weekly - 3 sets - 3-5 reps Standing Thoracic Extension at Wall - 2 x daily - 7 x weekly - 10 reps Hooklying Single Knee to Chest Stretch - 2 x daily - 7 x weekly - 3-5 reps

## 2021-06-15 ENCOUNTER — Ambulatory Visit: Payer: 59 | Admitting: Physical Therapy

## 2021-06-15 ENCOUNTER — Other Ambulatory Visit: Payer: Self-pay

## 2021-06-15 ENCOUNTER — Encounter: Payer: Self-pay | Admitting: Physical Therapy

## 2021-06-15 DIAGNOSIS — G8929 Other chronic pain: Secondary | ICD-10-CM | POA: Diagnosis not present

## 2021-06-15 DIAGNOSIS — R262 Difficulty in walking, not elsewhere classified: Secondary | ICD-10-CM

## 2021-06-15 DIAGNOSIS — M545 Low back pain, unspecified: Secondary | ICD-10-CM

## 2021-06-15 DIAGNOSIS — M6281 Muscle weakness (generalized): Secondary | ICD-10-CM

## 2021-06-15 NOTE — Therapy (Signed)
[facility] Physical Therapy [address] [place], [place], [address] Phone: [phone]   Fax:  [phone]  Physical Therapy Treatment  Patient Details  Name: [name] MRN: [id number] Date of Birth: April 13, 1966 Referring Provider (PT): Dr. Eunice Blase   Encounter Date: 06/15/2021   PT End of Session - 06/15/21 1212     Visit Number 2    Number of Visits 8    Date for PT Re-Evaluation 08/06/21    Authorization Type UMR    PT Start Time 1145    PT Stop Time 1220    PT Time Calculation (min) 35 min    Activity Tolerance Patient tolerated treatment well    Behavior During Therapy [facility] for tasks assessed/performed             Past Medical History:  Diagnosis Date   Arthritis    Family history of adverse reaction to anesthesia    mom has ponv   GERD (gastroesophageal reflux disease)     Past Surgical History:  Procedure Laterality Date   LIPOMA EXCISION Left 08/17/2016   Procedure: EXCISION OF SUBCUTANEOUS LIPOMA LEFT HIP;  Surgeon: Donnie Mesa, MD;  Location: [place];  Service: General;  Laterality: Left;    There were no vitals filed for this visit.   Subjective Assessment - 06/15/21 1200     Subjective Pt arriving today reporting 3/10 pain in her low back. Pt compliant in her HEP. Pt reproting she is going to work after her treatment.    Patient is accompained by: Family member    Pertinent History prediabetic    Limitations Standing;Walking;House hold activities;Lifting    Diagnostic tests X-ray    Patient Stated Goals Stop hurting, work without pain    Currently in Pain? Yes    Pain Location Back    Pain Orientation Lower    Pain Descriptors / Indicators Aching;Tightness    Pain Onset More than a month ago                               [facility] Adult PT Treatment/Exercise - 06/15/21 0001       Exercises   Exercises Lumbar      Modalities   Modalities Traction      Traction   Type  of Traction Lumbar    Min (lbs) 50    Max (lbs) 80    Hold Time lumbar protocol    Rest Time lumbar protocol    Time 25 minutes total                      PT Short Term Goals - 06/15/21 1237       PT SHORT TERM GOAL #1   Title Pt will be independent in her initial HEP.    Status On-going               PT Long Term Goals - 06/09/21 1128       PT LONG TERM GOAL #1   Title Pt will be independent in advaned HEP.    Time 8    Period Weeks    Status New    Target Date 08/06/21      PT LONG TERM GOAL #2   Title Pt will be able to lift 10# from floor to counter height with pain </= 2/10.    Time 8    Period Weeks  Status New    Target Date 08/06/21      PT LONG TERM GOAL #3   Title Pt will be able to increase her strength in hip flexion to 5/5.    Time 8    Period Weeks    Status New    Target Date 08/06/21      PT LONG TERM GOAL #4   Title Pt will be able to ambulate/stand for community mobility 30 minutes with pain </= 2/10.    Time 8    Period Weeks    Target Date 08/06/21      PT LONG TERM GOAL #5   Title Pt will improve her Foto score to >/= 72%.    Baseline 63% on 06/09/2021    Time 8    Period Weeks    Status New    Target Date 08/06/21                   Plan - 06/15/21 1217     Clinical Impression Statement Pt reporting 3/10 pain upon arrival in her low back. Pt still stating that returning to neutral position after movements is what increases her pain. Pt edu in procedure and mechanics of traction. Pt tolerating well reporting less pain at end of session. No furhter exercises were performed to better moniter full body's response to mechanical traction. Continue skilled PT to maximize function. Next visit assess response to traction after leaving clinic.    Examination-Activity Limitations Lift;Other;Squat;Stairs;Stand    Examination-Participation Restrictions Other;Occupation    Stability/Clinical Decision Making  Stable/Uncomplicated    Rehab Potential Good    PT Frequency 1x / week    PT Duration 8 weeks    PT Treatment/Interventions ADLs/Self Care Home Management;Electrical Stimulation;Iontophoresis '4mg'$ /ml Dexamethasone;Moist Heat;Traction;Ultrasound;Balance training;Therapeutic exercise;Therapeutic activities;Functional mobility training;Stair training;Gait training;Neuromuscular re-education;Patient/family education;Passive range of motion;Dry needling;Taping;Manual techniques;Spinal Manipulations;Joint Manipulations    PT Next Visit Plan assess response to traction, Nustep, lumbar stretching, extension, lumbar/ SI mobs if tolreated    PT Home Exercise Plan Access Code: [email or web address]  URL: [email or web address]  Date: 06/09/2021  Prepared by: Kearney Hard    Exercises  Supine Posterior Pelvic Tilt - 2 x daily - 7 x weekly - 10 reps - 5 seconds hold  Supine Lower Trunk Rotation - 2 x daily - 7 x weekly - 3 sets - 3-5 reps  Standing Thoracic Extension at Wall - 2 x daily - 7 x weekly - 10 reps  Hooklying Single Knee to Chest Stretch - 2 x daily - 7 x weekly - 3-5 reps    Consulted and Agree with Plan of Care Patient             Patient will benefit from skilled therapeutic intervention in order to improve the following deficits and impairments:     Visit Diagnosis: Chronic midline low back pain without sciatica  Difficulty in walking, not elsewhere classified  Muscle weakness (generalized)     Problem List Patient Active Problem List   Diagnosis Date Noted   Prediabetes 05/20/2021   Acute pain of left hip 09/07/2016    Oretha Caprice, PT, MPT 06/15/2021, 12:37 PM  [facility] Physical Therapy [address] [place], [place], [address] Phone: [phone]   Fax:  [phone]  Name: [name] MRN: [id number] Date of Birth: 02-04-1966

## 2021-06-23 ENCOUNTER — Ambulatory Visit (INDEPENDENT_AMBULATORY_CARE_PROVIDER_SITE_OTHER): Payer: 59 | Admitting: Physical Therapy

## 2021-06-23 ENCOUNTER — Other Ambulatory Visit: Payer: Self-pay

## 2021-06-23 ENCOUNTER — Encounter: Payer: Self-pay | Admitting: Physical Therapy

## 2021-06-23 DIAGNOSIS — G8929 Other chronic pain: Secondary | ICD-10-CM | POA: Diagnosis not present

## 2021-06-23 DIAGNOSIS — R262 Difficulty in walking, not elsewhere classified: Secondary | ICD-10-CM | POA: Diagnosis not present

## 2021-06-23 DIAGNOSIS — M6281 Muscle weakness (generalized): Secondary | ICD-10-CM | POA: Diagnosis not present

## 2021-06-23 DIAGNOSIS — M545 Low back pain, unspecified: Secondary | ICD-10-CM

## 2021-06-23 NOTE — Patient Instructions (Addendum)
Access Code: [email or web address] URL: [email or web address] Date: 06/23/2021 Prepared by: Almyra Free  Exercises Supine Posterior Pelvic Tilt - 2 x daily - 7 x weekly - 10 reps - 5 seconds hold Supine Lower Trunk Rotation - 2 x daily - 7 x weekly - 3 sets - 3-5 reps Standing Thoracic Extension at Wall - 2 x daily - 7 x weekly - 10 reps Hooklying Single Knee to Chest Stretch - 2 x daily - 7 x weekly - 3-5 reps Standard Plank - 1 x daily - 7 x weekly - 1 sets - 5 reps - max hold Side Plank on Knees - 1 x daily - 7 x weekly - 1 sets - 5 reps - max hold hold Wall Squat - 1 x daily - 7 x weekly - 1-3 sets - 10 reps

## 2021-06-23 NOTE — Therapy (Signed)
[facility] Physical Therapy [address] [place], [place], [address] Phone: [phone]   Fax:  [phone]  Physical Therapy Treatment  Patient Details  Name: [name] MRN: [id number] Date of Birth: 05-Jan-1966 Referring Provider (PT): Dr. Eunice Blase   Encounter Date: 06/23/2021   PT End of Session - 06/23/21 0934     Visit Number 3    Number of Visits 8    Date for PT Re-Evaluation 08/06/21    Authorization Type UMR    PT Start Time 0934    PT Stop Time 1030    PT Time Calculation (min) 56 min    Activity Tolerance Patient tolerated treatment well    Behavior During Therapy [facility] for tasks assessed/performed             Past Medical History:  Diagnosis Date   Arthritis    Family history of adverse reaction to anesthesia    mom has ponv   GERD (gastroesophageal reflux disease)     Past Surgical History:  Procedure Laterality Date   LIPOMA EXCISION Left 08/17/2016   Procedure: EXCISION OF SUBCUTANEOUS LIPOMA LEFT HIP;  Surgeon: Donnie Mesa, MD;  Location: [place];  Service: General;  Laterality: Left;    There were no vitals filed for this visit.   Subjective Assessment - 06/23/21 0935     Subjective Traction really helped, but pain returned by the end of the day.    Patient Stated Goals Stop hurting, work without pain    Currently in Pain? Yes    Pain Score 5     Pain Location Back    Pain Orientation Lower    Pain Type Chronic pain    Pain Onset More than a month ago                               [facility] Adult PT Treatment/Exercise - 06/23/21 0001       Lumbar Exercises: Standing   Other Standing Lumbar Exercises functional squat position with OH reach bil 10 sec hold to bil shoulder extension and scapular retraction x 10 sec; 2 sets      Lumbar Exercises: Quadruped   Plank 5 x max hold; then one each of modified side plank on knees      Modalities   Modalities Traction       Traction   Type of Traction Lumbar    Min (lbs) 60    Max (lbs) 90    Hold Time lumbar protocol    Rest Time lumbar protocol    Time 25 minutes total                    PT Education - 06/23/21 1009     Education Details Education on DDD and importance of strenghtening her low back and core; sit to stand transfer with neutral spine; plank and side plank added to HEP    Person(s) Educated Patient    Methods Explanation;Demonstration;Handout    Comprehension Verbalized understanding;Returned demonstration              PT Short Term Goals - 06/15/21 1237       PT SHORT TERM GOAL #1   Title Pt will be independent in her initial HEP.    Status On-going               PT Long Term Goals - 06/09/21 1128  PT LONG TERM GOAL #1   Title Pt will be independent in advaned HEP.    Time 8    Period Weeks    Status New    Target Date 08/06/21      PT LONG TERM GOAL #2   Title Pt will be able to lift 10# from floor to counter height with pain </= 2/10.    Time 8    Period Weeks    Status New    Target Date 08/06/21      PT LONG TERM GOAL #3   Title Pt will be able to increase her strength in hip flexion to 5/5.    Time 8    Period Weeks    Status New    Target Date 08/06/21      PT LONG TERM GOAL #4   Title Pt will be able to ambulate/stand for community mobility 30 minutes with pain </= 2/10.    Time 8    Period Weeks    Target Date 08/06/21      PT LONG TERM GOAL #5   Title Pt will improve her Foto score to >/= 72%.    Baseline 63% on 06/09/2021    Time 8    Period Weeks    Status New    Target Date 08/06/21                   Plan - 06/23/21 1018     Clinical Impression Statement Patient reporting relief with traction for that day. We increased traction to 90# today. Patient reluctant to exercise her back secondary to pain. Educated in importance of lumbar and core strength in decreasing her pain and improving function. She did well  with plank and modified side plank. She will benefit from progression to kettle bell lifts and dead lifts as well as squats to improve function at work and home.    PT Frequency 1x / week    PT Duration 8 weeks    PT Treatment/Interventions ADLs/Self Care Home Management;Electrical Stimulation;Iontophoresis '4mg'$ /ml Dexamethasone;Moist Heat;Traction;Ultrasound;Balance training;Therapeutic exercise;Therapeutic activities;Functional mobility training;Stair training;Gait training;Neuromuscular re-education;Patient/family education;Passive range of motion;Dry needling;Taping;Manual techniques;Spinal Manipulations;Joint Manipulations    PT Next Visit Plan assess response to traction, and planks; progress strengthening as tolerated;  Nustep, lumbar stretching, extension, lumbar/ SI mobs if tolreated    PT Home Exercise Plan Access Code: [email or web address]  URL: [email or web address]  Date: 06/09/2021  Prepared by: Kearney Hard    Exercises  Supine Posterior Pelvic Tilt - 2 x daily - 7 x weekly - 10 reps - 5 seconds hold  Supine Lower Trunk Rotation - 2 x daily - 7 x weekly - 3 sets - 3-5 reps  Standing Thoracic Extension at Wall - 2 x daily - 7 x weekly - 10 reps  Hooklying Single Knee to Chest Stretch - 2 x daily - 7 x weekly - 3-5 reps             Patient will benefit from skilled therapeutic intervention in order to improve the following deficits and impairments:     Visit Diagnosis: Chronic midline low back pain without sciatica  Difficulty in walking, not elsewhere classified  Muscle weakness (generalized)     Problem List Patient Active Problem List   Diagnosis Date Noted   Prediabetes 05/20/2021   Acute pain of left hip 09/07/2016    Madelyn Flavors PT 06/23/2021, 10:42 AM  [facility] Physical Therapy [address] [place], [place], [address] Phone: [phone]  Fax:  [phone]  Name: [name] MRN: [id number] Date of Birth: September 28, 1966

## 2021-06-25 ENCOUNTER — Other Ambulatory Visit: Payer: Self-pay

## 2021-06-30 ENCOUNTER — Ambulatory Visit (INDEPENDENT_AMBULATORY_CARE_PROVIDER_SITE_OTHER): Payer: 59 | Admitting: Physical Therapy

## 2021-06-30 ENCOUNTER — Other Ambulatory Visit: Payer: Self-pay

## 2021-06-30 DIAGNOSIS — M6281 Muscle weakness (generalized): Secondary | ICD-10-CM | POA: Diagnosis not present

## 2021-06-30 DIAGNOSIS — M545 Low back pain, unspecified: Secondary | ICD-10-CM | POA: Diagnosis not present

## 2021-06-30 DIAGNOSIS — G8929 Other chronic pain: Secondary | ICD-10-CM | POA: Diagnosis not present

## 2021-06-30 DIAGNOSIS — R262 Difficulty in walking, not elsewhere classified: Secondary | ICD-10-CM | POA: Diagnosis not present

## 2021-06-30 NOTE — Therapy (Signed)
[facility] Physical Therapy [address] [place], [place], [address] Phone: [phone]   Fax:  [phone]  Physical Therapy Treatment  Patient Details  Name: [name] MRN: [id number] Date of Birth: June 30, 1966 Referring Provider (PT): Dr. Eunice Blase   Encounter Date: 06/30/2021   PT End of Session - 06/30/21 1057     Visit Number 4    Number of Visits 8    Date for PT Re-Evaluation 08/06/21    Authorization Type UMR    PT Start Time 0935    PT Stop Time 1005    PT Time Calculation (min) 30 min    Activity Tolerance Patient tolerated treatment well    Behavior During Therapy [facility] for tasks assessed/performed             Past Medical History:  Diagnosis Date   Arthritis    Family history of adverse reaction to anesthesia    mom has ponv   GERD (gastroesophageal reflux disease)     Past Surgical History:  Procedure Laterality Date   LIPOMA EXCISION Left 08/17/2016   Procedure: EXCISION OF SUBCUTANEOUS LIPOMA LEFT HIP;  Surgeon: Donnie Mesa, MD;  Location: [place];  Service: General;  Laterality: Left;    There were no vitals filed for this visit.   Subjective Assessment - 06/30/21 1001     Subjective Pt stating traction helps short term. Discussed DN possibly for this visit.    Patient is accompained by: Family member    Pertinent History prediabetic    Limitations Standing;Walking;House hold activities;Lifting    Diagnostic tests X-ray    Patient Stated Goals Stop hurting, work without pain    Currently in Pain? Yes    Pain Score 5     Pain Location Back    Pain Orientation Lower    Pain Descriptors / Indicators Sore;Aching    Pain Type Chronic pain    Pain Onset More than a month ago    Pain Frequency Constant                               [facility] Adult PT Treatment/Exercise - 06/30/21 0001       Modalities   Modalities Traction      Traction   Type of Traction Lumbar    Min  (lbs) 70    Max (lbs) 90    Hold Time lumbar protocol    Rest Time lumbar protocol    Time 25 minutes                      PT Short Term Goals - 06/30/21 1030       PT SHORT TERM GOAL #1   Title Pt will be independent in her initial HEP.    Status On-going               PT Long Term Goals - 06/09/21 1128       PT LONG TERM GOAL #1   Title Pt will be independent in advaned HEP.    Time 8    Period Weeks    Status New    Target Date 08/06/21      PT LONG TERM GOAL #2   Title Pt will be able to lift 10# from floor to counter height with pain </= 2/10.    Time 8    Period Weeks    Status New  Target Date 08/06/21      PT LONG TERM GOAL #3   Title Pt will be able to increase her strength in hip flexion to 5/5.    Time 8    Period Weeks    Status New    Target Date 08/06/21      PT LONG TERM GOAL #4   Title Pt will be able to ambulate/stand for community mobility 30 minutes with pain </= 2/10.    Time 8    Period Weeks    Target Date 08/06/21      PT LONG TERM GOAL #5   Title Pt will improve her Foto score to >/= 72%.    Baseline 63% on 06/09/2021    Time 8    Period Weeks    Status New    Target Date 08/06/21                   Plan - 06/30/21 1025     Clinical Impression Statement Pt arriving today with 5/10 pain in her low back and reporting, "today is a bad day". Pt stating she had a long shift yesterday at work which hasn't helped. Traction performed initially with hopes of possibly try DN. Pt with no change in symptoms following traction today. Pain still reported as 5/10. We discussed contiuation of wearing her sacral support and continuing core strengthening and stability with her HEP. We discussed holding DN until next vist. DN  handout issued to pt. Continue skilled PT to maximize function.    Examination-Activity Limitations Lift;Other;Squat;Stairs;Stand    Examination-Participation Restrictions Other;Occupation     Stability/Clinical Decision Making Stable/Uncomplicated    Rehab Potential Good    PT Frequency 1x / week    PT Duration 8 weeks    PT Treatment/Interventions ADLs/Self Care Home Management;Electrical Stimulation;Iontophoresis '4mg'$ /ml Dexamethasone;Moist Heat;Traction;Ultrasound;Balance training;Therapeutic exercise;Therapeutic activities;Functional mobility training;Stair training;Gait training;Neuromuscular re-education;Patient/family education;Passive range of motion;Dry needling;Taping;Manual techniques;Spinal Manipulations;Joint Manipulations    PT Next Visit Plan DN next visit, core stability, planks; progress strengthening as tolerated;  Nustep, lumbar stretching, extension, lumbar/ SI mobs if tolreated,    PT Home Exercise Plan Access Code: [email or web address]  URL: [email or web address]  Date: 06/09/2021  Prepared by: Kearney Hard    Exercises  Supine Posterior Pelvic Tilt - 2 x daily - 7 x weekly - 10 reps - 5 seconds hold  Supine Lower Trunk Rotation - 2 x daily - 7 x weekly - 3 sets - 3-5 reps  Standing Thoracic Extension at Wall - 2 x daily - 7 x weekly - 10 reps  Hooklying Single Knee to Chest Stretch - 2 x daily - 7 x weekly - 3-5 reps    Consulted and Agree with Plan of Care Patient             Patient will benefit from skilled therapeutic intervention in order to improve the following deficits and impairments:     Visit Diagnosis: Chronic midline low back pain without sciatica  Difficulty in walking, not elsewhere classified  Muscle weakness (generalized)     Problem List Patient Active Problem List   Diagnosis Date Noted   Prediabetes 05/20/2021   Acute pain of left hip 09/07/2016    Oretha Caprice, PT, MPT 06/30/2021, 10:58 AM  [facility] Physical Therapy [address] [place], [place], [address] Phone: [phone]   Fax:  [phone]  Name: [name] MRN: [id number] Date of Birth: December 05, 1965

## 2021-07-02 ENCOUNTER — Ambulatory Visit (HOSPITAL_COMMUNITY)
Admission: RE | Admit: 2021-07-02 | Discharge: 2021-07-02 | Disposition: A | Discharge status: Home / Self Care | Payer: 59 | Attending: Gastroenterology | Admitting: Gastroenterology

## 2021-07-02 ENCOUNTER — Ambulatory Visit (HOSPITAL_COMMUNITY): Payer: 59 | Admitting: Certified Registered Nurse Anesthetist

## 2021-07-02 ENCOUNTER — Encounter (HOSPITAL_COMMUNITY)
Admission: RE | Disposition: A | Discharge status: Home / Self Care | Payer: Self-pay | Source: Home / Self Care | Attending: Gastroenterology

## 2021-07-02 ENCOUNTER — Encounter (HOSPITAL_COMMUNITY): Payer: Self-pay | Admitting: Gastroenterology

## 2021-07-02 DIAGNOSIS — K839 Disease of biliary tract, unspecified: Secondary | ICD-10-CM | POA: Insufficient documentation

## 2021-07-02 DIAGNOSIS — Z888 Allergy status to other drugs, medicaments and biological substances status: Secondary | ICD-10-CM | POA: Insufficient documentation

## 2021-07-02 DIAGNOSIS — K219 Gastro-esophageal reflux disease without esophagitis: Secondary | ICD-10-CM | POA: Diagnosis not present

## 2021-07-02 DIAGNOSIS — M1990 Unspecified osteoarthritis, unspecified site: Secondary | ICD-10-CM | POA: Diagnosis not present

## 2021-07-02 DIAGNOSIS — R7303 Prediabetes: Secondary | ICD-10-CM | POA: Diagnosis not present

## 2021-07-02 HISTORY — PX: ESOPHAGOGASTRODUODENOSCOPY (EGD) WITH PROPOFOL: SHX5813

## 2021-07-02 HISTORY — PX: UPPER ESOPHAGEAL ENDOSCOPIC ULTRASOUND (EUS): SHX6562

## 2021-07-02 SURGERY — ESOPHAGOGASTRODUODENOSCOPY (EGD) WITH PROPOFOL
Anesthesia: Monitor Anesthesia Care

## 2021-07-02 MED ORDER — LACTATED RINGERS IV SOLN: INTRAVENOUS | Status: DC

## 2021-07-02 MED ORDER — LIDOCAINE 2% (20 MG/ML) 5 ML SYRINGE
INTRAMUSCULAR | Status: DC | PRN
  Administered 2021-07-02: 100 mg via INTRAVENOUS

## 2021-07-02 MED ORDER — PROPOFOL 10 MG/ML IV BOLUS
INTRAVENOUS | Status: DC | PRN
  Administered 2021-07-02: 40 mg via INTRAVENOUS

## 2021-07-02 MED ORDER — PROPOFOL 500 MG/50ML IV EMUL
INTRAVENOUS | Status: DC | PRN
  Administered 2021-07-02: 150 ug/kg/min via INTRAVENOUS

## 2021-07-02 MED ORDER — FENTANYL CITRATE (PF) 100 MCG/2ML IJ SOLN
INTRAMUSCULAR | Status: AC
  Filled 2021-07-02: qty 2

## 2021-07-02 MED ORDER — MIDAZOLAM HCL (PF) 5 MG/ML IJ SOLN
INTRAMUSCULAR | Status: AC
  Filled 2021-07-02: qty 2

## 2021-07-02 MED ORDER — DIPHENHYDRAMINE HCL 50 MG/ML IJ SOLN
INTRAMUSCULAR | Status: AC
  Filled 2021-07-02: qty 1

## 2021-07-02 SURGICAL SUPPLY — 15 items

## 2021-07-02 NOTE — Transfer of Care (Signed)
Immediate Anesthesia Transfer of Care Note  Patient: [name]  Procedure(s) Performed: ESOPHAGOGASTRODUODENOSCOPY (EGD) WITH PROPOFOL UPPER ESOPHAGEAL ENDOSCOPIC ULTRASOUND (EUS)  Patient Location: Endoscopy Unit  Anesthesia Type:MAC  Level of Consciousness: awake, alert  and oriented  Airway & Oxygen Therapy: Patient Spontanous Breathing and Patient connected to face mask oxygen  Post-op Assessment: Report given to RN and Post -op Vital signs reviewed and stable  Post vital signs: Reviewed and stable  Last Vitals:  Vitals Value Taken Time  BP 148/72   Temp    Pulse 68 07/02/21 1304  Resp 14 07/02/21 1304  SpO2 100 % 07/02/21 1304  Vitals shown include unvalidated device data.  Last Pain:  Vitals:   07/02/21 1205  TempSrc: Oral  PainSc: 0-No pain         Complications: No notable events documented.

## 2021-07-02 NOTE — Op Note (Signed)
[facility] Patient Name: [name] Procedure Date: 07/02/2021 MRN: [id number] Attending MD: Carol Ada , MD Date of Birth: [birth year] CSN: [id number] Age: 55 Admit Type: Outpatient Procedure:                Upper EUS Indications:              Mucosal mass/polyp involving the major papilla                            (found on endoscopy) Providers:                Carol Ada, MD, Kary Kos RN, RN, Laverda Sorenson, Technician Referring MD:              Medicines:                Propofol per Anesthesia Complications:            No immediate complications. Estimated Blood Loss:     Estimated blood loss: none. Procedure:                Pre-Anesthesia Assessment:                           - Prior to the procedure, a History and Physical                            was performed, and patient medications and                            allergies were reviewed. The patient's tolerance of                            previous anesthesia was also reviewed. The risks                            and benefits of the procedure and the sedation                            options and risks were discussed with the patient.                            All questions were answered, and informed consent                            was obtained. Prior Anticoagulants: The patient has                            taken no previous anticoagulant or antiplatelet                            agents. ASA Grade Assessment: II - A patient with                            mild  systemic disease. After reviewing the risks                            and benefits, the patient was deemed in                            satisfactory condition to undergo the procedure.                           - Sedation was administered by an anesthesia                            professional. Deep sedation was attained.                           After obtaining informed consent, the endoscope was                             passed under direct vision. Throughout the                            procedure, the patient's blood pressure, pulse, and                            oxygen saturations were monitored continuously. The                            [id number]) Olympus linear ultrasound scope                            was introduced through the mouth, and advanced to                            the second part of duodenum. The upper EUS was                            accomplished without difficulty. The patient                            tolerated the procedure well. Scope In: Scope Out: Findings:      ENDOSCOPIC FINDING: :      The examined esophagus was endoscopically normal.      The entire examined stomach was endoscopically normal.      The examined duodenum was endoscopically normal.      ENDOSONOGRAPHIC FINDING: :      Visualized portions of the left lobe of the liver, spleen, left adrenal       gland, left kidney, common bile duct and pancreas were normal on       ultrasound examination. Impression:               - Normal esophagus.                           - Normal stomach.                           -  Normal examined duodenum.                           - No specimens collected. Moderate Sedation:      Not Applicable - Patient had care per Anesthesia. Recommendation:           - Patient has a contact number available for                            emergencies. The signs and symptoms of potential                            delayed complications were discussed with the                            patient. Return to normal activities tomorrow.                            Written discharge instructions were provided to the                            patient.                           - Resume regular diet. Procedure Code(s):        --- Professional ---                           [phone], Esophagogastroduodenoscopy, flexible,                            transoral; with  endoscopic ultrasound examination                            limited to the esophagus, stomach or duodenum, and                            adjacent structures Diagnosis Code(s):        --- Professional ---                           K83.9, Disease of biliary tract, unspecified CPT copyright 2019 [employer]. All rights reserved. The codes documented in this report are preliminary and upon coder review may  be revised to meet current compliance requirements. Carol Ada, MD Carol Ada, MD 07/02/2021 12:58:31 PM This report has been signed electronically. Number of Addenda: 0

## 2021-07-02 NOTE — H&P (Signed)
[name] HPI: During a routine EGD for GERD symptoms she was identified to have a large ampulla and she is here today for further evaluation with an EUS.  Past Medical History:  Diagnosis Date   Arthritis    Family history of adverse reaction to anesthesia    mom has ponv   GERD (gastroesophageal reflux disease)     Past Surgical History:  Procedure Laterality Date   LIPOMA EXCISION Left 08/17/2016   Procedure: EXCISION OF SUBCUTANEOUS LIPOMA LEFT HIP;  Surgeon: Donnie Mesa, MD;  Location: [place];  Service: General;  Laterality: Left;    Family History  Problem Relation Age of Onset   Breast cancer Mother     Social History:  reports that she has never smoked. She has never used smokeless tobacco. She reports current alcohol use. She reports that she does not use drugs.  Allergies:  Allergies  Allergen Reactions   Compazine [Prochlorperazine Edisylate] Other (See Comments)    angioedema   Flexeril [Cyclobenzaprine] Other (See Comments)    Severe confusion   Reglan [Metoclopramide] Other (See Comments)    Angioedema     Medications: Scheduled: Continuous:  lactated ringers      No results found for this or any previous visit (from the past 24 hour(s)).   No results found.  ROS:  As stated above in the HPI otherwise negative.  Height '5\' 8"'$  (1.727 m), weight 76.2 kg.    PE: Gen: NAD, Alert and Oriented HEENT:  [place]/AT, EOMI Neck: Supple, no LAD Lungs: CTA Bilaterally CV: RRR without M/G/R ABD: Soft, NTND, +BS Ext: No C/C/E  Assessment/Plan: 1) Prominent ampulla - EUS.  [name] D 07/02/2021, 7:00 AM

## 2021-07-02 NOTE — Anesthesia Preprocedure Evaluation (Signed)
Anesthesia Evaluation  Patient identified by MRN, date of birth, ID band Patient awake    Reviewed: Allergy & Precautions, NPO status , Patient's Chart, lab work & pertinent test results  History of Anesthesia Complications Negative for: history of anesthetic complications  Airway Mallampati: II  TM Distance: >3 FB Neck ROM: Full    Dental   Pulmonary neg pulmonary ROS,    Pulmonary exam normal        Cardiovascular negative cardio ROS Normal cardiovascular exam     Neuro/Psych negative neurological ROS     GI/Hepatic Neg liver ROS, GERD  ,  Endo/Other  negative endocrine ROS  Renal/GU negative Renal ROS  negative genitourinary   Musculoskeletal negative musculoskeletal ROS (+)   Abdominal   Peds  Hematology negative hematology ROS (+)   Anesthesia Other Findings   Reproductive/Obstetrics                             Anesthesia Physical Anesthesia Plan  ASA: 2  Anesthesia Plan: MAC   Post-op Pain Management:    Induction: Intravenous  PONV Risk Score and Plan: 2 and Propofol infusion, TIVA and Treatment may vary due to age or medical condition  Airway Management Planned: Natural Airway, Nasal Cannula and Simple Face Mask  Additional Equipment: None  Intra-op Plan:   Post-operative Plan:   Informed Consent: I have reviewed the patients History and Physical, chart, labs and discussed the procedure including the risks, benefits and alternatives for the proposed anesthesia with the patient or authorized representative who has indicated his/her understanding and acceptance.       Plan Discussed with:   Anesthesia Plan Comments:         Anesthesia Quick Evaluation

## 2021-07-02 NOTE — Discharge Instructions (Signed)

## 2021-07-02 NOTE — Anesthesia Postprocedure Evaluation (Signed)
Anesthesia Post Note  Patient: [name]  Procedure(s) Performed: ESOPHAGOGASTRODUODENOSCOPY (EGD) WITH PROPOFOL UPPER ESOPHAGEAL ENDOSCOPIC ULTRASOUND (EUS)     Patient location during evaluation: Endoscopy Anesthesia Type: MAC Level of consciousness: awake and alert Pain management: pain level controlled Vital Signs Assessment: post-procedure vital signs reviewed and stable Respiratory status: spontaneous breathing, nonlabored ventilation and respiratory function stable Cardiovascular status: blood pressure returned to baseline and stable Postop Assessment: no apparent nausea or vomiting Anesthetic complications: no   No notable events documented.  Last Vitals:  Vitals:   07/02/21 1310 07/02/21 1320  BP: 120/63 (!) 149/86  Pulse: 60 (!) 55  Resp: 18 16  Temp:    SpO2: 100% 100%    Last Pain:  Vitals:   07/02/21 1320  TempSrc:   PainSc: 0-No pain                 Lidia Collum

## 2021-07-06 ENCOUNTER — Encounter (HOSPITAL_COMMUNITY): Payer: Self-pay | Admitting: Gastroenterology

## 2021-07-06 ENCOUNTER — Other Ambulatory Visit (HOSPITAL_COMMUNITY): Payer: Self-pay

## 2021-07-08 ENCOUNTER — Encounter: Payer: Self-pay | Admitting: Physical Therapy

## 2021-07-08 ENCOUNTER — Ambulatory Visit: Payer: 59 | Admitting: Physical Therapy

## 2021-07-08 ENCOUNTER — Other Ambulatory Visit: Payer: Self-pay

## 2021-07-08 DIAGNOSIS — R262 Difficulty in walking, not elsewhere classified: Secondary | ICD-10-CM

## 2021-07-08 DIAGNOSIS — M545 Low back pain, unspecified: Secondary | ICD-10-CM | POA: Diagnosis not present

## 2021-07-08 DIAGNOSIS — M6281 Muscle weakness (generalized): Secondary | ICD-10-CM

## 2021-07-08 DIAGNOSIS — G8929 Other chronic pain: Secondary | ICD-10-CM

## 2021-07-08 NOTE — Patient Instructions (Signed)
Access Code: [email or web address] URL: [email or web address] Date: 07/08/2021 Prepared by: Faustino Congress  Exercises Supine Posterior Pelvic Tilt - 2 x daily - 7 x weekly - 10 reps - 5 seconds hold Supine Lower Trunk Rotation - 2 x daily - 7 x weekly - 3 sets - 3-5 reps Standing Thoracic Extension at Wall - 2 x daily - 7 x weekly - 10 reps Hooklying Single Knee to Chest Stretch - 2 x daily - 7 x weekly - 3-5 reps Standard Plank - 1 x daily - 7 x weekly - 1 sets - 5 reps - max hold Side Plank on Knees - 1 x daily - 7 x weekly - 1 sets - 5 reps - max hold hold Wall Squat - 1 x daily - 7 x weekly - 1-3 sets - 10 reps Cat Cow - 1 x daily - 7 x weekly - 10 reps - 1 sets Quadruped Alternating Leg Extensions - 1 x daily - 7 x weekly - 1 sets - 10 reps Quadruped Alternating Arm Lift - 1 x daily - 7 x weekly - 1 sets - 10 reps

## 2021-07-08 NOTE — Therapy (Signed)
[facility] Physical Therapy [address] [place], [place], [address] Phone: [phone]   Fax:  [phone]  Physical Therapy Treatment  Patient Details  Name: [name] MRN: [id number] Date of Birth: [birth year] Referring Provider (PT): Dr. Eunice Blase   Encounter Date: 07/08/2021   PT End of Session - 07/08/21 0931     Visit Number 5    Number of Visits 8    Date for PT Re-Evaluation 08/06/21    Authorization Type UMR    PT Start Time 0847    PT Stop Time 0920    PT Time Calculation (min) 33 min    Activity Tolerance Patient tolerated treatment well    Behavior During Therapy [facility] for tasks assessed/performed             Past Medical History:  Diagnosis Date   Arthritis    Family history of adverse reaction to anesthesia    mom has ponv   GERD (gastroesophageal reflux disease)     Past Surgical History:  Procedure Laterality Date   ESOPHAGOGASTRODUODENOSCOPY (EGD) WITH PROPOFOL N/A 07/02/2021   Procedure: ESOPHAGOGASTRODUODENOSCOPY (EGD) WITH PROPOFOL;  Surgeon: Carol Ada, MD;  Location: [facility];  Service: Endoscopy;  Laterality: N/A;   LIPOMA EXCISION Left 08/17/2016   Procedure: EXCISION OF SUBCUTANEOUS LIPOMA LEFT HIP;  Surgeon: Donnie Mesa, MD;  Location: [place];  Service: General;  Laterality: Left;   UPPER ESOPHAGEAL ENDOSCOPIC ULTRASOUND (EUS) N/A 07/02/2021   Procedure: UPPER ESOPHAGEAL ENDOSCOPIC ULTRASOUND (EUS);  Surgeon: Carol Ada, MD;  Location: [facility];  Service: Endoscopy;  Laterality: N/A;    There were no vitals filed for this visit.   Subjective Assessment - 07/08/21 0849     Subjective had a rough week last week    Patient is accompained by: Family member    Pertinent History prediabetic    Limitations Standing;Walking;House hold activities;Lifting    Diagnostic tests X-ray    Patient Stated Goals Stop hurting, work without pain    Pain Score 3     Pain Location Back    Pain  Orientation Lower    Pain Descriptors / Indicators Aching;Sore    Pain Type Chronic pain    Pain Onset More than a month ago    Pain Frequency Constant    Aggravating Factors  lifting, prolonged standing, bending    Pain Relieving Factors stretching, heat, OTC pain med                               [facility] Adult PT Treatment/Exercise - 07/08/21 0909       Exercises   Exercises Other Exercises    Other Exercises  discussed seated exercises to perform at work including TA engagement, gradual flexion exercises and knee to chest      Lumbar Exercises: Stretches   Other Lumbar Stretch Exercise childs pose x 15 sec hold      Lumbar Exercises: Seated   Other Seated Lumbar Exercises pelvic tilts x 10    Other Seated Lumbar Exercises ab set x 10 reps      Manual Therapy   Manual therapy comments skilled palpation and monitoring of soft tissue during DN, L4-S1 CPA mobs and STM to paraspinals              Trigger Point Dry Needling - 07/08/21 0930     Consent Given? Yes    Education Handout Provided Yes  inbasket   Muscles Treated Back/Hip Lumbar multifidi    Lumbar multifidi Response Twitch response elicited                   PT Education - 07/08/21 0931     Education Details added quadruped exercises    Person(s) Educated Patient    Methods Explanation;Demonstration;Handout    Comprehension Verbalized understanding;Returned demonstration;Need further instruction              PT Short Term Goals - 06/30/21 1030       PT SHORT TERM GOAL #1   Title Pt will be independent in her initial HEP.    Status On-going               PT Long Term Goals - 07/08/21 0932       PT LONG TERM GOAL #1   Title Pt will be independent in advaned HEP.    Time 8    Period Weeks    Status On-going    Target Date 08/06/21      PT LONG TERM GOAL #2   Title Pt will be able to lift 10# from floor to counter height with pain </= 2/10.    Time 8     Period Weeks    Status On-going      PT LONG TERM GOAL #3   Title Pt will be able to increase her strength in hip flexion to 5/5.    Time 8    Period Weeks    Status On-going      PT LONG TERM GOAL #4   Title Pt will be able to ambulate/stand for community mobility 30 minutes with pain </= 2/10.    Time 8    Period Weeks    Status On-going      PT LONG TERM GOAL #5   Title Pt will improve her Foto score to >/= 72%.    Baseline 63% on 06/09/2021    Time 8    Period Weeks    Status On-going                   Plan - 07/08/21 1016     Clinical Impression Statement Pt tolerated session well with initial positive response to DN today and reported decreased pain initially.  Will continue to benefit from PT to maximize function.    Examination-Activity Limitations Lift;Other;Squat;Stairs;Stand    Examination-Participation Restrictions Other;Occupation    Stability/Clinical Decision Making Stable/Uncomplicated    Rehab Potential Good    PT Frequency 1x / week    PT Duration 8 weeks    PT Treatment/Interventions ADLs/Self Care Home Management;Electrical Stimulation;Iontophoresis '4mg'$ /ml Dexamethasone;Moist Heat;Traction;Ultrasound;Balance training;Therapeutic exercise;Therapeutic activities;Functional mobility training;Stair training;Gait training;Neuromuscular re-education;Patient/family education;Passive range of motion;Dry needling;Taping;Manual techniques;Spinal Manipulations;Joint Manipulations    PT Next Visit Plan assess DN response,  core stability, planks; progress strengthening as tolerated;  Nustep, lumbar stretching, extension, lumbar/ SI mobs if tolreated,    PT Home Exercise Plan Access Code: [email or web address]    Consulted and Agree with Plan of Care Patient             Patient will benefit from skilled therapeutic intervention in order to improve the following deficits and impairments:     Visit Diagnosis: Chronic midline low back pain without sciatica  Difficulty  in walking, not elsewhere classified  Muscle weakness (generalized)     Problem List Patient Active Problem List   Diagnosis Date Noted   Prediabetes 05/20/2021   Acute  pain of left hip 09/07/2016      Laureen Abrahams, PT, DPT 07/08/21 10:21 AM    [facility] Physical Therapy [address] [place], [place], [address] Phone: [phone]   Fax:  [phone]  Name: [name] MRN: [id number] Date of Birth: 09-Aug-1966

## 2021-07-12 ENCOUNTER — Other Ambulatory Visit: Payer: Self-pay

## 2021-07-12 ENCOUNTER — Ambulatory Visit (INDEPENDENT_AMBULATORY_CARE_PROVIDER_SITE_OTHER): Payer: 59 | Admitting: Physical Therapy

## 2021-07-12 ENCOUNTER — Encounter: Payer: Self-pay | Admitting: Physical Therapy

## 2021-07-12 DIAGNOSIS — M545 Low back pain, unspecified: Secondary | ICD-10-CM | POA: Diagnosis not present

## 2021-07-12 DIAGNOSIS — R262 Difficulty in walking, not elsewhere classified: Secondary | ICD-10-CM | POA: Diagnosis not present

## 2021-07-12 DIAGNOSIS — G8929 Other chronic pain: Secondary | ICD-10-CM | POA: Diagnosis not present

## 2021-07-12 DIAGNOSIS — M6281 Muscle weakness (generalized): Secondary | ICD-10-CM

## 2021-07-12 NOTE — Therapy (Signed)
[facility] Physical Therapy [address] [place], [place], [address] Phone: [phone]   Fax:  [phone]  Physical Therapy Treatment  Patient Details  Name: [name] MRN: [id number] Date of Birth: May 23, 1966 Referring Provider (PT): Dr. Eunice Blase   Encounter Date: 07/12/2021   PT End of Session - 07/12/21 1011     Visit Number 6    Number of Visits 8    Date for PT Re-Evaluation 08/06/21    Authorization Type UMR    PT Start Time 0932    PT Stop Time 1013    PT Time Calculation (min) 41 min    Activity Tolerance Patient tolerated treatment well    Behavior During Therapy [facility] for tasks assessed/performed             Past Medical History:  Diagnosis Date   Arthritis    Family history of adverse reaction to anesthesia    mom has ponv   GERD (gastroesophageal reflux disease)     Past Surgical History:  Procedure Laterality Date   ESOPHAGOGASTRODUODENOSCOPY (EGD) WITH PROPOFOL N/A 07/02/2021   Procedure: ESOPHAGOGASTRODUODENOSCOPY (EGD) WITH PROPOFOL;  Surgeon: Carol Ada, MD;  Location: [facility];  Service: Endoscopy;  Laterality: N/A;   LIPOMA EXCISION Left 08/17/2016   Procedure: EXCISION OF SUBCUTANEOUS LIPOMA LEFT HIP;  Surgeon: Donnie Mesa, MD;  Location: [place];  Service: General;  Laterality: Left;   UPPER ESOPHAGEAL ENDOSCOPIC ULTRASOUND (EUS) N/A 07/02/2021   Procedure: UPPER ESOPHAGEAL ENDOSCOPIC ULTRASOUND (EUS);  Surgeon: Carol Ada, MD;  Location: [facility];  Service: Endoscopy;  Laterality: N/A;    There were no vitals filed for this visit.   Subjective Assessment - 07/12/21 0949     Subjective Pt reporting today's pain was 2/10. Having a better day. Pt stating she feels the DN helped at last visit.    Pertinent History prediabetic    Limitations Standing;Walking;House hold activities;Lifting    Diagnostic tests X-ray    Patient Stated Goals Stop hurting, work without pain    Currently  in Pain? Yes    Pain Score 2     Pain Location Back    Pain Orientation Lower    Pain Descriptors / Indicators Sore;Aching    Pain Type Chronic pain    Pain Onset More than a month ago                               [facility] Adult PT Treatment/Exercise - 07/12/21 0001       Lumbar Exercises: Stretches   Single Knee to Chest Stretch Left;Right;2 reps;20 seconds    Double Knee to Chest Stretch 20 seconds;2 reps    Other Lumbar Stretch Exercise childs pose x 15 sec hold      Lumbar Exercises: Supine   Ab Set 10 reps    AB Set Limitations 10 seconds    Pelvic Tilt 10 reps;10 seconds      Lumbar Exercises: Quadruped   Opposite Arm/Leg Raise 5 reps;Right arm/Left leg;Left arm/Right leg;3 seconds    Plank 5 x holding 20 seconds each      Manual Therapy   Manual therapy comments percussion and STM  to lumbar paraspinals              Trigger Point Dry Needling - 07/12/21 0001     Consent Given? Yes    Education Handout Provided Previously provided    Muscles Treated Back/Hip Lumbar  multifidi   Lt and Rt inferior   Lumbar multifidi Response Twitch response elicited                     PT Short Term Goals - 06/30/21 1030       PT SHORT TERM GOAL #1   Title Pt will be independent in her initial HEP.    Status On-going               PT Long Term Goals - 07/08/21 0932       PT LONG TERM GOAL #1   Title Pt will be independent in advaned HEP.    Time 8    Period Weeks    Status On-going    Target Date 08/06/21      PT LONG TERM GOAL #2   Title Pt will be able to lift 10# from floor to counter height with pain </= 2/10.    Time 8    Period Weeks    Status On-going      PT LONG TERM GOAL #3   Title Pt will be able to increase her strength in hip flexion to 5/5.    Time 8    Period Weeks    Status On-going      PT LONG TERM GOAL #4   Title Pt will be able to ambulate/stand for community mobility 30 minutes with pain </= 2/10.     Time 8    Period Weeks    Status On-going      PT LONG TERM GOAL #5   Title Pt will improve her Foto score to >/= 72%.    Baseline 63% on 06/09/2021    Time 8    Period Weeks    Status On-going                   Plan - 07/12/21 1014     Clinical Impression Statement Pt tolerating core stability exercises and STM to lumbar musculature using percussion device. DN performed today by Scot Jun, PT, DPT, OCS, ATC. Pt reporting relief at end of session. Continue skilled PT to maximize function.    Examination-Activity Limitations Lift;Other;Squat;Stairs;Stand    Examination-Participation Restrictions Other;Occupation    Stability/Clinical Decision Making Stable/Uncomplicated    Rehab Potential Good    PT Frequency 1x / week    PT Duration 8 weeks    PT Treatment/Interventions ADLs/Self Care Home Management;Electrical Stimulation;Iontophoresis '4mg'$ /ml Dexamethasone;Moist Heat;Traction;Ultrasound;Balance training;Therapeutic exercise;Therapeutic activities;Functional mobility training;Stair training;Gait training;Neuromuscular re-education;Patient/family education;Passive range of motion;Dry needling;Taping;Manual techniques;Spinal Manipulations;Joint Manipulations    PT Next Visit Plan assess DN response again,  core stability, planks; progress strengthening as tolerated, lumbar stretching, lumbar/ SI mobs if tolreated,    PT Home Exercise Plan Access Code: [place] and Agree with Plan of Care Patient             Patient will benefit from skilled therapeutic intervention in order to improve the following deficits and impairments:  Pain, Decreased strength, Decreased range of motion, Decreased activity tolerance, Difficulty walking  Visit Diagnosis: Chronic midline low back pain without sciatica  Difficulty in walking, not elsewhere classified  Muscle weakness (generalized)     Problem List Patient Active Problem List   Diagnosis Date Noted    Prediabetes 05/20/2021   Acute pain of left hip 09/07/2016    Oretha Caprice, PT, MPT 07/12/2021, 2:00 PM  [facility] Physical Therapy [address] [place], [place], [address] Phone:  [phone]   Fax:  [phone]  Name: [name] MRN: [id number] Date of Birth: [birth year]

## 2021-07-14 ENCOUNTER — Ambulatory Visit: Payer: 59

## 2021-07-14 ENCOUNTER — Other Ambulatory Visit (HOSPITAL_COMMUNITY): Payer: Self-pay

## 2021-07-14 ENCOUNTER — Telehealth: Payer: Self-pay

## 2021-07-14 ENCOUNTER — Other Ambulatory Visit: Payer: Self-pay

## 2021-07-14 NOTE — Telephone Encounter (Signed)
Pt contacted to advise that awaiting shingles vaccine order to arrive..will call when vaccine is here to schedule appt

## 2021-07-21 ENCOUNTER — Other Ambulatory Visit: Payer: Self-pay

## 2021-07-21 ENCOUNTER — Ambulatory Visit (INDEPENDENT_AMBULATORY_CARE_PROVIDER_SITE_OTHER): Payer: 59 | Admitting: Physical Therapy

## 2021-07-21 ENCOUNTER — Ambulatory Visit (INDEPENDENT_AMBULATORY_CARE_PROVIDER_SITE_OTHER): Payer: 59

## 2021-07-21 ENCOUNTER — Encounter: Payer: Self-pay | Admitting: Physical Therapy

## 2021-07-21 DIAGNOSIS — M545 Low back pain, unspecified: Secondary | ICD-10-CM

## 2021-07-21 DIAGNOSIS — M6281 Muscle weakness (generalized): Secondary | ICD-10-CM | POA: Diagnosis not present

## 2021-07-21 DIAGNOSIS — R262 Difficulty in walking, not elsewhere classified: Secondary | ICD-10-CM

## 2021-07-21 DIAGNOSIS — G8929 Other chronic pain: Secondary | ICD-10-CM

## 2021-07-21 DIAGNOSIS — Z23 Encounter for immunization: Secondary | ICD-10-CM

## 2021-07-21 NOTE — Therapy (Signed)
[facility] Physical Therapy [address] [place], [place], [address] Phone: [phone]   Fax:  [phone]  Physical Therapy Treatment  Patient Details  Name: [name] MRN: [id number] Date of Birth: 1966-07-14 Referring Provider (PT): Dr. Eunice Blase   Encounter Date: 07/21/2021   PT End of Session - 07/21/21 0904     Visit Number 7    Number of Visits 8    Date for PT Re-Evaluation 08/06/21    Authorization Type UMR    PT Start Time 0850    PT Stop Time 0930    PT Time Calculation (min) 40 min    Activity Tolerance Patient tolerated treatment well    Behavior During Therapy [facility] for tasks assessed/performed             Past Medical History:  Diagnosis Date   Arthritis    Family history of adverse reaction to anesthesia    mom has ponv   GERD (gastroesophageal reflux disease)     Past Surgical History:  Procedure Laterality Date   ESOPHAGOGASTRODUODENOSCOPY (EGD) WITH PROPOFOL N/A 07/02/2021   Procedure: ESOPHAGOGASTRODUODENOSCOPY (EGD) WITH PROPOFOL;  Surgeon: Carol Ada, MD;  Location: [facility];  Service: Endoscopy;  Laterality: N/A;   LIPOMA EXCISION Left 08/17/2016   Procedure: EXCISION OF SUBCUTANEOUS LIPOMA LEFT HIP;  Surgeon: Donnie Mesa, MD;  Location: [place];  Service: General;  Laterality: Left;   UPPER ESOPHAGEAL ENDOSCOPIC ULTRASOUND (EUS) N/A 07/02/2021   Procedure: UPPER ESOPHAGEAL ENDOSCOPIC ULTRASOUND (EUS);  Surgeon: Carol Ada, MD;  Location: [facility];  Service: Endoscopy;  Laterality: N/A;    There were no vitals filed for this visit.                      [facility] Adult PT Treatment/Exercise - 07/21/21 0001       Lumbar Exercises: Stretches   Single Knee to Chest Stretch Left;Right;2 reps;20 seconds    Double Knee to Chest Stretch 20 seconds;2 reps    Other Lumbar Stretch Exercise childs pose x 15 sec hold      Lumbar Exercises: Aerobic   Nustep L6 x 5 minutes       Lumbar Exercises: Supine   Ab Set 10 reps    AB Set Limitations 10 seconds    Pelvic Tilt 10 reps;10 seconds      Lumbar Exercises: Quadruped   Opposite Arm/Leg Raise 5 reps;Right arm/Left leg;Left arm/Right leg;3 seconds    Plank 5 x holding 20 seconds each      Modalities   Modalities Traction      Traction   Type of Traction Lumbar    Min (lbs) 70    Max (lbs) 90    Hold Time lumbar protocol    Rest Time lumbar protocol    Time 25 minutes                       PT Short Term Goals - 06/30/21 1030       PT SHORT TERM GOAL #1   Title Pt will be independent in her initial HEP.    Status Partially Met               PT Long Term Goals - 07/21/21 0908       PT LONG TERM GOAL #1   Title Pt will be independent in advaned HEP.    Status On-going      PT LONG TERM GOAL #2  Title Pt will be able to lift 10# from floor to counter height with pain </= 2/10.    Status On-going      PT LONG TERM GOAL #3   Title Pt will be able to increase her strength in hip flexion to 5/5.    Period Weeks    Status On-going      PT LONG TERM GOAL #4   Title Pt will be able to ambulate/stand for community mobility 30 minutes with pain </= 2/10.    Status On-going      PT LONG TERM GOAL #5   Title Pt will improve her Foto score to >/= 72%.    Status On-going                   Plan - 07/21/21 0905     Clinical Impression Statement Pt arriving today reporting 2/10 low back pain. Pt requesting traction and wishing to hold off on DN since there was such a good response after last treatment. Focusing on core stabilty/strengthening and mechical traction performed.    Examination-Activity Limitations Lift;Other;Squat;Stairs;Stand    Examination-Participation Restrictions Other;Occupation    Stability/Clinical Decision Making Stable/Uncomplicated    Rehab Potential Good    PT Frequency 1x / week    PT Duration 8 weeks    PT Treatment/Interventions ADLs/Self  Care Home Management;Electrical Stimulation;Iontophoresis 3m/ml Dexamethasone;Moist Heat;Traction;Ultrasound;Balance training;Therapeutic exercise;Therapeutic activities;Functional mobility training;Stair training;Gait training;Neuromuscular re-education;Patient/family education;Passive range of motion;Dry needling;Taping;Manual techniques;Spinal Manipulations;Joint Manipulations    PT Next Visit Plan core stability, planks; progress strengthening as tolerated, lumbar stretching, lumbar/ SI mobs if tolreated, DN is indicated    PT Home Exercise Plan Access Code: [email or web address] Agree with Plan of Care Patient             Patient will benefit from skilled therapeutic intervention in order to improve the following deficits and impairments:  Pain, Decreased strength, Decreased range of motion, Decreased activity tolerance, Difficulty walking  Visit Diagnosis: Chronic midline low back pain without sciatica  Difficulty in walking, not elsewhere classified  Muscle weakness (generalized)     Problem List Patient Active Problem List   Diagnosis Date Noted   Prediabetes 05/20/2021   Acute pain of left hip 09/07/2016    JOretha Caprice PT, MPT 07/21/2021, 9:51 AM  [facility] [address] [address]Phone: [phone]  Fax:  [phone] Name: Kellie FISHBAUGHMRN: [id number] of Birth: 505-18-1967

## 2021-07-28 ENCOUNTER — Encounter: Payer: 59 | Admitting: Physical Therapy

## 2021-07-30 ENCOUNTER — Ambulatory Visit: Payer: 59 | Attending: Internal Medicine

## 2021-07-30 ENCOUNTER — Other Ambulatory Visit: Payer: Self-pay

## 2021-07-30 ENCOUNTER — Other Ambulatory Visit (HOSPITAL_BASED_OUTPATIENT_CLINIC_OR_DEPARTMENT_OTHER): Payer: Self-pay

## 2021-07-30 DIAGNOSIS — Z23 Encounter for immunization: Secondary | ICD-10-CM

## 2021-07-30 MED ORDER — PFIZER COVID-19 VAC BIVALENT 30 MCG/0.3ML IM SUSP
INTRAMUSCULAR | 0 refills | Status: DC
  Filled 2021-07-30: qty 0.3, 1d supply, fill #0

## 2021-07-30 NOTE — Progress Notes (Signed)
   Covid-19 Vaccination Clinic  Name:  ALEX MCMANIGAL    MRN: [id number] DOB: 1966/07/02  07/30/2021  Ms. Eyerly was observed post Covid-19 immunization for 15 minutes without incident. She was provided with Vaccine Information Sheet and instruction to access the V-Safe system.   Ms. Delair was instructed to call 911 with any severe reactions post vaccine: Difficulty breathing  Swelling of face and throat  A fast heartbeat  A bad rash all over body  Dizziness and weakness

## 2021-08-13 ENCOUNTER — Encounter: Payer: Self-pay | Admitting: Physical Therapy

## 2021-08-13 ENCOUNTER — Other Ambulatory Visit: Payer: Self-pay

## 2021-08-13 ENCOUNTER — Ambulatory Visit (INDEPENDENT_AMBULATORY_CARE_PROVIDER_SITE_OTHER): Payer: 59 | Admitting: Physical Therapy

## 2021-08-13 DIAGNOSIS — R262 Difficulty in walking, not elsewhere classified: Secondary | ICD-10-CM

## 2021-08-13 DIAGNOSIS — M6281 Muscle weakness (generalized): Secondary | ICD-10-CM | POA: Diagnosis not present

## 2021-08-13 DIAGNOSIS — G8929 Other chronic pain: Secondary | ICD-10-CM | POA: Diagnosis not present

## 2021-08-13 DIAGNOSIS — M545 Low back pain, unspecified: Secondary | ICD-10-CM | POA: Diagnosis not present

## 2021-08-13 NOTE — Therapy (Addendum)
[facility] [address] [place], [place], [address] Phone: [phone]   Fax:  [phone]  Physical Therapy Treatment/Discharge   Patient Details  Name: [name] MRN: [id number] Date of Birth: Mar 27, 1966 Referring Provider (PT): Dr. Eunice Blase   Encounter Date: 08/13/2021   PT End of Session - 08/13/21 0926     Visit Number 8    Number of Visits 8    Date for PT Re-Evaluation 09/10/21    Authorization Type UMR    PT Start Time 0846    PT Stop Time 0935    PT Time Calculation (min) 49 min    Activity Tolerance Patient tolerated treatment well    Behavior During Therapy [facility] for tasks assessed/performed             Past Medical History:  Diagnosis Date   Arthritis    Family history of adverse reaction to anesthesia    mom has ponv   GERD (gastroesophageal reflux disease)     Past Surgical History:  Procedure Laterality Date   ESOPHAGOGASTRODUODENOSCOPY (EGD) WITH PROPOFOL N/A 07/02/2021   Procedure: ESOPHAGOGASTRODUODENOSCOPY (EGD) WITH PROPOFOL;  Surgeon: Carol Ada, MD;  Location: [facility];  Service: Endoscopy;  Laterality: N/A;   LIPOMA EXCISION Left 08/17/2016   Procedure: EXCISION OF SUBCUTANEOUS LIPOMA LEFT HIP;  Surgeon: Donnie Mesa, MD;  Location: [place];  Service: General;  Laterality: Left;   UPPER ESOPHAGEAL ENDOSCOPIC ULTRASOUND (EUS) N/A 07/02/2021   Procedure: UPPER ESOPHAGEAL ENDOSCOPIC ULTRASOUND (EUS);  Surgeon: Carol Ada, MD;  Location: [facility];  Service: Endoscopy;  Laterality: N/A;    There were no vitals filed for this visit.   Subjective Assessment - 08/13/21 0848     Subjective doing "about the same" doesn't want DN today.    Pertinent History prediabetic    Limitations Standing;Walking;House hold activities;Lifting    How long can you walk comfortably? 20-30 min with pain 1-2/10    Diagnostic tests X-ray    Patient Stated Goals Stop hurting, work without  pain    Currently in Pain? Yes    Pain Score 2     Pain Location Back    Pain Orientation Lower    Pain Descriptors / Indicators Aching;Sore    Pain Type Chronic pain    Pain Onset More than a month ago    Pain Frequency Constant    Aggravating Factors  lifting, prolonged standing, bending    Pain Relieving Factors stretching, heat, OTC pain meds                [facility] PT Assessment - 08/13/21 0904       Assessment   Medical Diagnosis m47.816 lumbar facet arthropathy    Referring Provider (PT) Dr. Legrand Como Hilts      Observation/Other Assessments   Focus on Therapeutic Outcomes (FOTO)  72      Strength   Right Hip Flexion 5/5    Left Hip Flexion 5/5                           [facility] Adult PT Treatment/Exercise - 08/13/21 0848       Exercises   Other Exercises  discussed HEP and general strengthening/exercise - including modifications and letting pain be guide for exercise.      Lumbar Exercises: Aerobic   Nustep L6 x 6 minutes      Traction   Type of Traction Lumbar    Min (  lbs) 70    Max (lbs) 90    Hold Time 60    Rest Time 20    Time 15                       PT Short Term Goals - 08/13/21 0927       PT SHORT TERM GOAL #1   Title Pt will be independent in her initial HEP.    Status Achieved               PT Long Term Goals - 08/13/21 0927       PT LONG TERM GOAL #1   Title Pt will be independent in advaned HEP.    Status Achieved      PT LONG TERM GOAL #2   Title Pt will be able to lift 10# from floor to counter height with pain </= 2/10.    Status Achieved      PT LONG TERM GOAL #3   Title Pt will be able to increase her strength in hip flexion to 5/5.    Period Weeks    Status Achieved      PT LONG TERM GOAL #4   Title Pt will be able to ambulate/stand for community mobility 30 minutes with pain </= 2/10.    Status Achieved      PT LONG TERM GOAL #5   Title Pt will improve her Foto score to >/= 72%.     Status Achieved                   Plan - 08/13/21 3299     Clinical Impression Statement Pt has met all goals at this time for PT, and will plan to hold PT x 30 days to work on continued exercise and community fitness.  Discussed next steps should pain increase including return to MD and/or PT.  Pt verbalized understanding and in agreement.    Examination-Activity Limitations Lift;Other;Squat;Stairs;Stand    Examination-Participation Restrictions Other;Occupation    Stability/Clinical Decision Making Stable/Uncomplicated    Rehab Potential Good    PT Frequency 1x / week    PT Duration 8 weeks    PT Treatment/Interventions ADLs/Self Care Home Management;Electrical Stimulation;Iontophoresis 80m/ml Dexamethasone;Moist Heat;Traction;Ultrasound;Balance training;Therapeutic exercise;Therapeutic activities;Functional mobility training;Stair training;Gait training;Neuromuscular re-education;Patient/family education;Passive range of motion;Dry needling;Taping;Manual techniques;Spinal Manipulations;Joint Manipulations    PT Next Visit Plan hold PT x 30 days, pt to return if needed, if not will d/c    PT Home Exercise Plan Access [place]: [email or web address] Agree with Plan of Care Patient             Patient will benefit from skilled therapeutic intervention in order to improve the following deficits and impairments:  Pain, Decreased strength, Decreased range of motion, Decreased activity tolerance, Difficulty walking  Visit Diagnosis: Chronic midline low back pain without sciatica - Plan: PT plan of care cert/re-cert  Difficulty in walking, not elsewhere classified - Plan: PT plan of care cert/re-cert  Muscle weakness (generalized) - Plan: PT plan of care cert/re-cert     Problem List Patient Active Problem List   Diagnosis Date Noted   Prediabetes 05/20/2021   Acute pain of left hip 09/07/2016      SLaureen Abrahams PT, DPT 08/13/21 9:34 AM  PHYSICAL THERAPY  DISCHARGE SUMMARY  Visits from Start of Care: 8  Current functional level related to goals / functional outcomes: See note   Remaining deficits: See note  Education / Equipment: HEP   Patient agrees to discharge. Patient goals were met. Patient is being discharged due to meeting the stated rehab goals.  Scot Jun, PT, DPT, OCS, ATC 10/12/21  9:59 AM     [facility] [address] [place], [place], [address] Phone: [phone]   Fax:  [phone]  Name: [name] MRN: [id number] Date of Birth: 1966-04-19

## 2021-08-13 NOTE — Therapy (Signed)
[facility] [address] [place], [place], [address] Phone: [phone]   Fax:  [phone]  Physical Therapy Treatment  Patient Details  Name: [name] MRN: [id number] Date of Birth: [birth year] Referring Provider (PT): Dr. Eunice Blase   Encounter Date: 08/13/2021    Past Medical History:  Diagnosis Date   Arthritis    Family history of adverse reaction to anesthesia    mom has ponv   GERD (gastroesophageal reflux disease)     Past Surgical History:  Procedure Laterality Date   ESOPHAGOGASTRODUODENOSCOPY (EGD) WITH PROPOFOL N/A 07/02/2021   Procedure: ESOPHAGOGASTRODUODENOSCOPY (EGD) WITH PROPOFOL;  Surgeon: Carol Ada, MD;  Location: [facility];  Service: Endoscopy;  Laterality: N/A;   LIPOMA EXCISION Left 08/17/2016   Procedure: EXCISION OF SUBCUTANEOUS LIPOMA LEFT HIP;  Surgeon: Donnie Mesa, MD;  Location: [place];  Service: General;  Laterality: Left;   UPPER ESOPHAGEAL ENDOSCOPIC ULTRASOUND (EUS) N/A 07/02/2021   Procedure: UPPER ESOPHAGEAL ENDOSCOPIC ULTRASOUND (EUS);  Surgeon: Carol Ada, MD;  Location: [facility];  Service: Endoscopy;  Laterality: N/A;    There were no vitals filed for this visit.   Subjective Assessment - 08/13/21 0848     Subjective doing "about the same" doesn't want DN today.    Pertinent History prediabetic    Limitations Standing;Walking;House hold activities;Lifting    Diagnostic tests X-ray    Patient Stated Goals Stop hurting, work without pain    Currently in Pain? Yes    Pain Score 2     Pain Location Back    Pain Orientation Lower    Pain Descriptors / Indicators Aching;Sore    Pain Type Chronic pain    Pain Onset More than a month ago    Pain Frequency Constant    Aggravating Factors  lifting, prolonged standing, bending    Pain Relieving Factors stretching, heat, OTC pain meds                               [facility] Adult PT  Treatment/Exercise - 08/13/21 0848       Lumbar Exercises: Aerobic   Nustep L6 x 6 minutes                       PT Short Term Goals - 06/30/21 1030       PT SHORT TERM GOAL #1   Title Pt will be independent in her initial HEP.    Status Partially Met               PT Long Term Goals - 07/21/21 0908       PT LONG TERM GOAL #1   Title Pt will be independent in advaned HEP.    Status On-going      PT LONG TERM GOAL #2   Title Pt will be able to lift 10# from floor to counter height with pain </= 2/10.    Status On-going      PT LONG TERM GOAL #3   Title Pt will be able to increase her strength in hip flexion to 5/5.    Period Weeks    Status On-going      PT LONG TERM GOAL #4   Title Pt will be able to ambulate/stand for community mobility 30 minutes with pain </= 2/10.    Status On-going      PT LONG TERM GOAL #5   Title Pt  will improve her Foto score to >/= 72%.    Status On-going                    Patient will benefit from skilled therapeutic intervention in order to improve the following deficits and impairments:     Visit Diagnosis: No diagnosis found.     Problem List Patient Active Problem List   Diagnosis Date Noted   Prediabetes 05/20/2021   Acute pain of left hip 09/07/2016    Faustino Congress, PT 08/13/2021, 8:56 AM  [facility] [address] [place], [place], [address] Phone: [phone]   Fax:  [phone]  Name: [name] MRN: [id number] Date of Birth: [birth year]

## 2021-09-24 ENCOUNTER — Other Ambulatory Visit (HOSPITAL_COMMUNITY): Payer: Self-pay

## 2021-09-30 ENCOUNTER — Other Ambulatory Visit (HOSPITAL_COMMUNITY): Payer: Self-pay

## 2021-10-21 ENCOUNTER — Other Ambulatory Visit (HOSPITAL_COMMUNITY): Payer: Self-pay

## 2021-10-21 DIAGNOSIS — K5904 Chronic idiopathic constipation: Secondary | ICD-10-CM | POA: Diagnosis not present

## 2021-10-21 DIAGNOSIS — K219 Gastro-esophageal reflux disease without esophagitis: Secondary | ICD-10-CM | POA: Diagnosis not present

## 2021-10-21 DIAGNOSIS — R140 Abdominal distension (gaseous): Secondary | ICD-10-CM | POA: Diagnosis not present

## 2021-10-21 MED ORDER — FAMOTIDINE 40 MG PO TABS
40.0000 mg | ORAL_TABLET | Freq: Two times a day (BID) | ORAL | 4 refills | Status: DC
  Filled 2021-10-21: qty 180, 90d supply, fill #0
  Filled 2022-02-08: qty 180, 90d supply, fill #1
  Filled 2022-05-04: qty 180, 90d supply, fill #2
  Filled 2022-08-19: qty 180, 90d supply, fill #3

## 2021-11-17 ENCOUNTER — Ambulatory Visit: Payer: 59

## 2021-11-17 ENCOUNTER — Ambulatory Visit: Payer: 59 | Admitting: Podiatry

## 2021-11-17 ENCOUNTER — Other Ambulatory Visit: Payer: Self-pay

## 2021-11-17 DIAGNOSIS — M722 Plantar fascial fibromatosis: Secondary | ICD-10-CM | POA: Diagnosis not present

## 2021-11-17 NOTE — Progress Notes (Signed)
SITUATION Reason for Consult: Evaluation for Bilateral Custom Foot Orthoses Patient / Caregiver Report: Patient is ready for foot orthotics  OBJECTIVE DATA: Patient History / Diagnosis:    ICD-10-CM   1. Plantar fasciitis, bilateral  M72.2       Current or Previous Devices: Custom foot orthotics from [employer] Examination: Skin presentation:   Intact Ulcers & Callousing:   None and no history Toe / Foot Deformities:  None Weight Bearing Presentation:  Rectus Sensation:    Intact  ORTHOTIC RECOMMENDATION Recommended Device: 1x pair of custom functional foot orthotics  GOALS OF ORTHOSES - Reduce Pain - Prevent Foot Deformity - Prevent Progression of Further Foot Deformity - Relieve Pressure - Improve the Overall Biomechanical Function of the Foot and Lower Extremity.  ACTIONS PERFORMED Patient was casted for Foot Orthoses via crush box. Procedure was explained and patient tolerated procedure well. All questions were answered and concerns addressed.  PLAN Potential out of pocket cost was communicated to patient. Casts are to be sent to [facility] for fabrication. Patient is to be called for fitting when devices are ready.

## 2021-11-26 NOTE — Progress Notes (Signed)
° °  Subjective: 56 y.o. female presenting today for follow up evaluation of chronic plantar fasciitis to the bilateral feet.  Patient states that orthotics helped significantly.  Her orthotics are beginning to wear out and she would like a new pair.  She presents for further treatment and evaluation   Past Medical History:  Diagnosis Date   Arthritis    Family history of adverse reaction to anesthesia    mom has ponv   GERD (gastroesophageal reflux disease)      Objective: Physical Exam General: The patient is alert and oriented x3 in no acute distress.  Dermatology: Skin is warm, dry and supple bilateral lower extremities. Negative for open lesions or macerations bilateral.   Vascular: Dorsalis Pedis and Posterior Tibial pulses palpable bilateral.  Capillary fill time is immediate to all digits.  Neurological: Epicritic and protective threshold intact bilateral.   Musculoskeletal: There is some slight tenderness to palpation to the plantar aspect of the bilateral heels along the plantar fascia. All other joints range of motion within normal limits bilateral. Strength 5/5 in all groups bilateral.   Assessment: 1. plantar fasciitis bilateral feet  Plan of Care:  1. Patient evaluated.  2.  Patient declined injections today 3.  Appointment with Pedorthist for custom molded orthotics 4.  Return to clinic as needed  Edrick Kins, DPM [facility]  Dr. Edrick Kins, DPM    [address], [place] [address]                Office [phone]  Fax [phone]

## 2021-12-17 ENCOUNTER — Telehealth: Payer: Self-pay

## 2021-12-17 ENCOUNTER — Ambulatory Visit: Payer: 59

## 2021-12-17 ENCOUNTER — Other Ambulatory Visit: Payer: Self-pay

## 2021-12-17 DIAGNOSIS — M722 Plantar fascial fibromatosis: Secondary | ICD-10-CM

## 2021-12-17 NOTE — Progress Notes (Signed)
SITUATION: Reason for Visit: Fitting and Delivery of Custom Fabricated Foot Orthoses Patient Report: Patient reports comfort and is satisfied with device.  OBJECTIVE DATA: Patient History / Diagnosis:     ICD-10-CM   1. Plantar fasciitis, bilateral  M72.2       Provided Device:  Custom Functional Foot Orthotics     [employer]: [id number]  GOAL OF ORTHOSIS - Improve gait - Decrease energy expenditure - Improve Balance - Provide Triplanar stability of foot complex - Facilitate motion  ACTIONS PERFORMED Patient was fit with foot orthotics trimmed to shoe last. Patient tolerated fittign procedure.   Patient was provided with verbal and written instruction and demonstration regarding donning, doffing, wear, care, proper fit, function, purpose, cleaning, and use of the orthosis and in all related precautions and risks and benefits regarding the orthosis.  Patient was also provided with verbal instruction regarding how to report any failures or malfunctions of the orthosis and necessary follow up care. Patient was also instructed to contact our office regarding any change in status that may affect the function of the orthosis.  Patient demonstrated independence with proper donning, doffing, and fit and verbalized understanding of all instructions.  Patient also requests a refurbishment of her previous pair.  PLAN: Patient is to follow up in one week or as necessary (PRN). All questions were answered and concerns addressed. Plan of care was discussed with and agreed upon by the patient.

## 2021-12-17 NOTE — Telephone Encounter (Signed)
Orthotics sent to [facility] for refurbishing

## 2022-01-06 ENCOUNTER — Telehealth: Payer: Self-pay

## 2022-01-06 ENCOUNTER — Ambulatory Visit (INDEPENDENT_AMBULATORY_CARE_PROVIDER_SITE_OTHER): Payer: 59

## 2022-01-06 ENCOUNTER — Other Ambulatory Visit: Payer: Self-pay

## 2022-01-06 DIAGNOSIS — M722 Plantar fascial fibromatosis: Secondary | ICD-10-CM

## 2022-01-06 NOTE — Telephone Encounter (Signed)
Foot Orthotics Sent to [facility] [employer] for Adjustment ?

## 2022-01-06 NOTE — Progress Notes (Signed)
SITUATION: ?Reason for Visit: Fitting and Delivery of Custom Fabricated Foot Orthoses ?Patient Report: Patient reports comfort and is satisfied with device. ? ?OBJECTIVE DATA: ?Patient History / Diagnosis:   ?  ICD-10-CM   ?1. Plantar fasciitis, bilateral  M72.2   ?  ? ? ?Provided Device:  Custom Functional Foot Orthotics ?    [employer] [phone] ? ?GOAL OF ORTHOSIS ?- Improve gait ?- Decrease energy expenditure ?- Improve Balance ?- Provide Triplanar stability of foot complex ?- Facilitate motion ? ?ACTIONS PERFORMED ?Patient was fit with foot orthotics trimmed to shoe last. Patient tolerated fittign procedure.  ? ?Patient was provided with verbal and written instruction and demonstration regarding donning, doffing, wear, care, proper fit, function, purpose, cleaning, and use of the orthosis and in all related precautions and risks and benefits regarding the orthosis. ? ?Patient was also provided with verbal instruction regarding how to report any failures or malfunctions of the orthosis and necessary follow up care. Patient was also instructed to contact our office regarding any change in status that may affect the function of the orthosis. ? ?Patient demonstrated independence with proper donning, doffing, and fit and verbalized understanding of all instructions. ? ?Patient is having problems with previously delivered set [phone] - they are not providing adequate heel relief. Based on previous set, heel cut-out needs to be removed and 64m met pad needs to be added bilaterally ? ?PLAN: ?Patient is to follow up in one week or as necessary (PRN). All questions were answered and concerns addressed. Plan of care was discussed with and agreed upon by the patient. ? ?

## 2022-01-12 DIAGNOSIS — H5213 Myopia, bilateral: Secondary | ICD-10-CM | POA: Diagnosis not present

## 2022-01-19 ENCOUNTER — Encounter: Payer: Self-pay | Admitting: Family

## 2022-01-20 ENCOUNTER — Ambulatory Visit: Payer: 59

## 2022-01-20 ENCOUNTER — Other Ambulatory Visit: Payer: Self-pay

## 2022-01-20 DIAGNOSIS — M722 Plantar fascial fibromatosis: Secondary | ICD-10-CM

## 2022-01-20 DIAGNOSIS — M79673 Pain in unspecified foot: Secondary | ICD-10-CM

## 2022-01-20 NOTE — Progress Notes (Signed)

## 2022-01-26 ENCOUNTER — Ambulatory Visit (INDEPENDENT_AMBULATORY_CARE_PROVIDER_SITE_OTHER): Payer: 59 | Admitting: Otolaryngology

## 2022-02-08 ENCOUNTER — Other Ambulatory Visit (HOSPITAL_COMMUNITY): Payer: Self-pay

## 2022-05-04 ENCOUNTER — Other Ambulatory Visit (HOSPITAL_COMMUNITY): Payer: Self-pay

## 2022-05-04 DIAGNOSIS — H6123 Impacted cerumen, bilateral: Secondary | ICD-10-CM | POA: Diagnosis not present

## 2022-05-04 DIAGNOSIS — H9011 Conductive hearing loss, unilateral, right ear, with unrestricted hearing on the contralateral side: Secondary | ICD-10-CM | POA: Diagnosis not present

## 2022-05-04 MED ORDER — PANTOPRAZOLE SODIUM 40 MG PO TBEC
40.0000 mg | DELAYED_RELEASE_TABLET | Freq: Every day | ORAL | 2 refills | Status: DC
  Filled 2022-05-04: qty 90, 90d supply, fill #0
  Filled 2022-08-19: qty 90, 90d supply, fill #1
  Filled 2022-12-07 – 2022-12-20 (×3): qty 90, 90d supply, fill #2

## 2022-05-19 ENCOUNTER — Other Ambulatory Visit: Payer: Self-pay | Admitting: Family

## 2022-05-19 DIAGNOSIS — Z1231 Encounter for screening mammogram for malignant neoplasm of breast: Secondary | ICD-10-CM

## 2022-05-20 ENCOUNTER — Ambulatory Visit
Admission: RE | Admit: 2022-05-20 | Discharge: 2022-05-20 | Disposition: A | Discharge status: Home / Self Care | Payer: 59 | Source: Ambulatory Visit | Attending: Family | Admitting: Family

## 2022-05-20 DIAGNOSIS — Z1231 Encounter for screening mammogram for malignant neoplasm of breast: Secondary | ICD-10-CM

## 2022-06-10 DIAGNOSIS — H9011 Conductive hearing loss, unilateral, right ear, with unrestricted hearing on the contralateral side: Secondary | ICD-10-CM | POA: Diagnosis not present

## 2022-06-15 DIAGNOSIS — Z01419 Encounter for gynecological examination (general) (routine) without abnormal findings: Secondary | ICD-10-CM | POA: Diagnosis not present

## 2022-06-24 ENCOUNTER — Ambulatory Visit (INDEPENDENT_AMBULATORY_CARE_PROVIDER_SITE_OTHER): Payer: 59 | Admitting: Family

## 2022-06-24 ENCOUNTER — Encounter: Payer: Self-pay | Admitting: Family

## 2022-06-24 VITALS — BP 136/80 | HR 80 | Temp 98.3°F | Resp 16 | Ht 68.11 in

## 2022-06-24 DIAGNOSIS — Z0000 Encounter for general adult medical examination without abnormal findings: Secondary | ICD-10-CM

## 2022-06-24 DIAGNOSIS — Z13220 Encounter for screening for lipoid disorders: Secondary | ICD-10-CM

## 2022-06-24 DIAGNOSIS — Z130 Encounter for screening for diseases of the blood and blood-forming organs and certain disorders involving the immune mechanism: Secondary | ICD-10-CM | POA: Diagnosis not present

## 2022-06-24 DIAGNOSIS — Z13228 Encounter for screening for other metabolic disorders: Secondary | ICD-10-CM | POA: Diagnosis not present

## 2022-06-24 DIAGNOSIS — Z0001 Encounter for general adult medical examination with abnormal findings: Secondary | ICD-10-CM | POA: Diagnosis not present

## 2022-06-24 DIAGNOSIS — R7303 Prediabetes: Secondary | ICD-10-CM

## 2022-06-24 DIAGNOSIS — Z1329 Encounter for screening for other suspected endocrine disorder: Secondary | ICD-10-CM | POA: Diagnosis not present

## 2022-06-24 NOTE — Patient Instructions (Signed)
Preventive Care 56-56 Years Old, Female Preventive care refers to lifestyle choices and visits with your health care provider that can promote health and wellness. Preventive care visits are also called wellness exams. What can I expect for my preventive care visit? Counseling Your health care provider may ask you questions about your: Medical history, including: Past medical problems. Family medical history. Pregnancy history. Current health, including: Menstrual cycle. Method of birth control. Emotional well-being. Home life and relationship well-being. Sexual activity and sexual health. Lifestyle, including: Alcohol, nicotine or tobacco, and drug use. Access to firearms. Diet, exercise, and sleep habits. Work and work environment. Sunscreen use. Safety issues such as seatbelt and bike helmet use. Physical exam Your health care provider will check your: Height and weight. These may be used to calculate your BMI (body mass index). BMI is a measurement that tells if you are at a healthy weight. Waist circumference. This measures the distance around your waistline. This measurement also tells if you are at a healthy weight and may help predict your risk of certain diseases, such as type 2 diabetes and high blood pressure. Heart rate and blood pressure. Body temperature. Skin for abnormal spots. What immunizations do I need?  Vaccines are usually given at various ages, according to a schedule. Your health care provider will recommend vaccines for you based on your age, medical history, and lifestyle or other factors, such as travel or where you work. What tests do I need? Screening Your health care provider may recommend screening tests for certain conditions. This may include: Lipid and cholesterol levels. Diabetes screening. This is done by checking your blood sugar (glucose) after you have not eaten for a while (fasting). Pelvic exam and Pap test. Hepatitis B test. Hepatitis C  test. HIV (human immunodeficiency virus) test. STI (sexually transmitted infection) testing, if you are at risk. Lung cancer screening. Colorectal cancer screening. Mammogram. Talk with your health care provider about when you should start having regular mammograms. This may depend on whether you have a family history of breast cancer. BRCA-related cancer screening. This may be done if you have a family history of breast, ovarian, tubal, or peritoneal cancers. Bone density scan. This is done to screen for osteoporosis. Talk with your health care provider about your test results, treatment options, and if necessary, the need for more tests. Follow these instructions at home: Eating and drinking  Eat a diet that includes fresh fruits and vegetables, whole grains, lean protein, and low-fat dairy products. Take vitamin and mineral supplements as recommended by your health care provider. Do not drink alcohol if: Your health care provider tells you not to drink. You are pregnant, may be pregnant, or are planning to become pregnant. If you drink alcohol: Limit how much you have to 0-1 drink a day. Know how much alcohol is in your drink. In the [place] one drink equals one 12 oz bottle of beer (355 mL), one 5 oz glass of wine (148 mL), or one 1 oz glass of hard liquor (44 mL). Lifestyle Brush your teeth every morning and night with fluoride toothpaste. Floss one time each day. Exercise for at least 30 minutes 5 or more days each week. Do not use any products that contain nicotine or tobacco. These products include cigarettes, chewing tobacco, and vaping devices, such as e-cigarettes. If you need help quitting, ask your health care provider. Do not use drugs. If you are sexually active, practice safe sex. Use a condom or other form of protection to   prevent STIs. If you do not wish to become pregnant, use a form of birth control. If you plan to become pregnant, see your health care provider for a  prepregnancy visit. Take aspirin only as told by your health care provider. Make sure that you understand how much to take and what form to take. Work with your health care provider to find out whether it is safe and beneficial for you to take aspirin daily. Find healthy ways to manage stress, such as: Meditation, yoga, or listening to music. Journaling. Talking to a trusted person. Spending time with friends and family. Minimize exposure to UV radiation to reduce your risk of skin cancer. Safety Always wear your seat belt while driving or riding in a vehicle. Do not drive: If you have been drinking alcohol. Do not ride with someone who has been drinking. When you are tired or distracted. While texting. If you have been using any mind-altering substances or drugs. Wear a helmet and other protective equipment during sports activities. If you have firearms in your house, make sure you follow all gun safety procedures. Seek help if you have been physically or sexually abused. What's next? Visit your health care provider once a year for an annual wellness visit. Ask your health care provider how often you should have your eyes and teeth checked. Stay up to date on all vaccines. This information is not intended to replace advice given to you by your health care provider. Make sure you discuss any questions you have with your health care provider. Document Revised: 04/14/2021 Document Reviewed: 04/14/2021 Elsevier Patient Education  [place].

## 2022-06-24 NOTE — Progress Notes (Signed)
.  Pt presents for annual physical exam   -completed pap w/Sheronette Cousins on 06/15/22 -mammogram completed 05/20/22

## 2022-06-24 NOTE — Progress Notes (Signed)
Patient ID: EUPHA LOBB, female    DOB: May 25, 1966  MRN: [id number]  CC: Annual Physical Exam  Subjective: [name] is a 56 y.o. female who presents for annual physical exam.  Her concerns today include:  - PAP smear completed with Servando Salina, MD on 06/15/22. - Mammogram completed 05/20/22.  - No questions or concerns for today.   Patient Active Problem List   Diagnosis Date Noted   Prediabetes 05/20/2021   Acute pain of left hip 09/07/2016     Current Outpatient Medications on File Prior to Visit  Medication Sig Dispense Refill   Turmeric (QC TUMERIC COMPLEX) 500 MG CAPS Take 500 mg by mouth daily.     acetaminophen (TYLENOL) 500 MG tablet Take 1,000 mg by mouth every 6 (six) hours as needed for moderate pain.     Apoaequorin (PREVAGEN PO) Take 1 tablet by mouth daily.     baclofen (LIORESAL) 10 MG tablet Take 0.5-1 tablets (5-10 mg total) by mouth at bedtime as needed for muscle spasms. 30 each 3   calcium carbonate (TUMS - DOSED IN MG ELEMENTAL CALCIUM) 500 MG chewable tablet Chew 4 tablets by mouth daily.     cholecalciferol (VITAMIN D) 25 MCG (1000 UNIT) tablet Take 1,000 Units by mouth in the morning and at bedtime.     diclofenac Sodium (VOLTAREN) 1 % GEL Apply 1 application topically daily as needed (pain).     docusate sodium (COLACE) 100 MG capsule Take 100 mg by mouth 2 (two) times daily.     famotidine (PEPCID) 40 MG tablet Take 1 tablet (40 mg total) by mouth 2 (two) times daily. 180 tablet 4   ibuprofen (ADVIL) 200 MG tablet Take 400-600 mg by mouth every 6 (six) hours as needed for moderate pain.     pantoprazole (PROTONIX) 40 MG tablet Take 1 tablet (40 mg total) by mouth daily. 90 tablet 2   Sod Picosulfate-Mag Ox-Cit Acd (CLENPIQ) 10-3.5-12 MG-GM -GM/160ML SOLN Take by mouth as directed per office and not instructions on packaging 320 mL 0   vitamin E 180 MG (400 UNITS) capsule Take 400 Units by mouth daily.     No current facility-administered  medications on file prior to visit.    Allergies  Allergen Reactions   Compazine [Prochlorperazine Edisylate] Other (See Comments)    angioedema   Flexeril [Cyclobenzaprine] Other (See Comments)    Severe confusion   Prochlorperazine Other (See Comments)   Reglan [Metoclopramide] Other (See Comments)    Angioedema     Social History   Socioeconomic History   Marital status: Married    Spouse name: Not on file   Number of children: Not on file   Years of education: Not on file   Highest education level: Not on file  Occupational History   Not on file  Tobacco Use   Smoking status: Never    Passive exposure: Never   Smokeless tobacco: Never  Substance and Sexual Activity   Alcohol use: Yes   Drug use: No   Sexual activity: Not on file  Other Topics Concern   Not on file  Social History Narrative   Not on file   Social Determinants of Health   Financial Resource Strain: Not on file  Food Insecurity: Not on file  Transportation Needs: Not on file  Physical Activity: Not on file  Stress: Not on file  Social Connections: Not on file  Intimate Partner Violence: Not on file    Family  History  Problem Relation Age of Onset   Breast cancer Mother     Past Surgical History:  Procedure Laterality Date   ESOPHAGOGASTRODUODENOSCOPY (EGD) WITH PROPOFOL N/A 07/02/2021   Procedure: ESOPHAGOGASTRODUODENOSCOPY (EGD) WITH PROPOFOL;  Surgeon: Carol Ada, MD;  Location: [facility];  Service: Endoscopy;  Laterality: N/A;   LIPOMA EXCISION Left 08/17/2016   Procedure: EXCISION OF SUBCUTANEOUS LIPOMA LEFT HIP;  Surgeon: Donnie Mesa, MD;  Location: [facility];  Service: General;  Laterality: Left;   UPPER ESOPHAGEAL ENDOSCOPIC ULTRASOUND (EUS) N/A 07/02/2021   Procedure: UPPER ESOPHAGEAL ENDOSCOPIC ULTRASOUND (EUS);  Surgeon: Carol Ada, MD;  Location: [facility];  Service: Endoscopy;  Laterality: N/A;    ROS: Review of Systems Negative except as stated  above  PHYSICAL EXAM: BP 136/80 (BP Location: Left Arm, Patient Position: Sitting, Cuff Size: Normal)   Pulse 80   Temp 98.3 F (36.8 C)   Resp 16   Ht 5' 8.11" (1.73 m)   BMI 25.01 kg/m   Physical Exam HENT:     Head: Normocephalic and atraumatic.     Right Ear: Tympanic membrane, ear canal and external ear normal.     Left Ear: Tympanic membrane, ear canal and external ear normal.     Nose: Nose normal.     Mouth/Throat:     Mouth: Mucous membranes are moist.     Pharynx: Oropharynx is clear.  Eyes:     Extraocular Movements: Extraocular movements intact.     Conjunctiva/sclera: Conjunctivae normal.     Pupils: Pupils are equal, round, and reactive to light.  Cardiovascular:     Rate and Rhythm: Normal rate and regular rhythm.     Pulses: Normal pulses.     Heart sounds: Normal heart sounds.  Pulmonary:     Effort: Pulmonary effort is normal.     Breath sounds: Normal breath sounds.  Chest:     Comments: Patient declined.  Abdominal:     General: Bowel sounds are normal.     Palpations: Abdomen is soft.  Genitourinary:    Comments: Patient declined.  Musculoskeletal:        General: Normal range of motion.     Right shoulder: Normal.     Left shoulder: Normal.     Right upper arm: Normal.     Left upper arm: Normal.     Right elbow: Normal.     Left elbow: Normal.     Right forearm: Normal.     Left forearm: Normal.     Right wrist: Normal.     Left wrist: Normal.     Right hand: Normal.     Left hand: Normal.     Cervical back: Normal, normal range of motion and neck supple.     Thoracic back: Normal.     Lumbar back: Normal.     Right hip: Normal.     Left hip: Normal.     Right upper leg: Normal.     Left upper leg: Normal.     Right knee: Normal.     Left knee: Normal.     Right lower leg: Normal.     Left lower leg: Normal.     Right ankle: Normal.     Left ankle: Normal.     Right foot: Normal.     Left foot: Normal.  Skin:    General: Skin  is warm and dry.     Capillary Refill: Capillary refill takes less than 2 seconds.  Neurological:     General: No focal deficit present.     Mental Status: She is alert and oriented to person, place, and time.  Psychiatric:        Mood and Affect: Mood normal.        Behavior: Behavior normal.     ASSESSMENT AND PLAN: 1. Annual physical exam - Counseled on 150 minutes of exercise per week as tolerated, healthy eating (including decreased daily intake of saturated fats, cholesterol, added sugars, sodium), STI prevention, and routine healthcare maintenance.  2. Screening for metabolic disorder - IOM35+DHRC to check kidney function, liver function, and electrolyte balance.  - CMP14+EGFR  3. Screening for deficiency anemia - CBC to screen for anemia. - CBC  4. Screening cholesterol level - Lipid panel to screen for high cholesterol.  - Lipid panel  5. Thyroid disorder screen - TSH to check thyroid function.  - TSH  6. Prediabetes - Update screening.  - Hemoglobin A1c    Patient was given the opportunity to ask questions.  Patient verbalized understanding of the plan and was able to repeat key elements of the plan. Patient was given clear instructions to go to Emergency Department or return to medical center if symptoms don't improve, worsen, or new problems develop.The patient verbalized understanding.   Orders Placed This Encounter  Procedures   CBC   Lipid panel   TSH   CMP14+EGFR   Hemoglobin A1c     Return in about 1 year (around 06/25/2023) for Physical per patient preference.  Camillia Herter, NP

## 2022-06-25 LAB — CMP14+EGFR
ALT: 21 IU/L (ref 0–32)
AST: 26 IU/L (ref 0–40)
Albumin/Globulin Ratio: 2.5 — ABNORMAL HIGH (ref 1.2–2.2)
Albumin: 5 g/dL — ABNORMAL HIGH (ref 3.8–4.9)
Alkaline Phosphatase: 68 IU/L (ref 44–121)
BUN/Creatinine Ratio: 18 (ref 9–23)
BUN: 17 mg/dL (ref 6–24)
Bilirubin Total: 0.3 mg/dL (ref 0.0–1.2)
CO2: 20 mmol/L (ref 20–29)
Calcium: 9.8 mg/dL (ref 8.7–10.2)
Chloride: 103 mmol/L (ref 96–106)
Creatinine, Ser: 0.92 mg/dL (ref 0.57–1.00)
Globulin, Total: 2 g/dL (ref 1.5–4.5)
Glucose: 83 mg/dL (ref 70–99)
Potassium: 4.4 mmol/L (ref 3.5–5.2)
Sodium: 142 mmol/L (ref 134–144)
Total Protein: 7 g/dL (ref 6.0–8.5)
eGFR: 73 mL/min/{1.73_m2} (ref >59)

## 2022-06-25 LAB — LIPID PANEL
Chol/HDL Ratio: 2 ratio (ref 0.0–4.4)
Cholesterol, Total: 168 mg/dL (ref 100–199)
HDL: 82 mg/dL (ref >39)
LDL Chol Calc (NIH): 77 mg/dL (ref 0–99)
Triglycerides: 43 mg/dL (ref 0–149)
VLDL Cholesterol Cal: 9 mg/dL (ref 5–40)

## 2022-06-25 LAB — CBC
Hematocrit: 38.7 % (ref 34.0–46.6)
Hemoglobin: 13 g/dL (ref 11.1–15.9)
MCH: 31.1 pg (ref 26.6–33.0)
MCHC: 33.6 g/dL (ref 31.5–35.7)
MCV: 93 fL (ref 79–97)
Platelets: 241 10*3/uL (ref 150–450)
RBC: 4.18 x10E6/uL (ref 3.77–5.28)
RDW: 12.6 % (ref 11.7–15.4)
WBC: 3.8 10*3/uL (ref 3.4–10.8)

## 2022-06-25 LAB — TSH: TSH: 0.618 u[IU]/mL (ref 0.450–4.500)

## 2022-06-25 LAB — HEMOGLOBIN A1C
Est. average glucose Bld gHb Est-mCnc: 114 mg/dL
Hgb A1c MFr Bld: 5.6 % (ref 4.8–5.6)

## 2022-08-15 ENCOUNTER — Other Ambulatory Visit (HOSPITAL_BASED_OUTPATIENT_CLINIC_OR_DEPARTMENT_OTHER): Payer: Self-pay

## 2022-08-15 MED ORDER — COMIRNATY 30 MCG/0.3ML IM SUSP
INTRAMUSCULAR | 0 refills | Status: DC
  Filled 2022-08-15: qty 0.3, 1d supply, fill #0

## 2022-08-19 ENCOUNTER — Other Ambulatory Visit (HOSPITAL_COMMUNITY): Payer: Self-pay

## 2022-12-07 ENCOUNTER — Other Ambulatory Visit (HOSPITAL_COMMUNITY): Payer: Self-pay

## 2022-12-16 ENCOUNTER — Other Ambulatory Visit (HOSPITAL_COMMUNITY): Payer: Self-pay

## 2022-12-19 ENCOUNTER — Other Ambulatory Visit (HOSPITAL_COMMUNITY): Payer: Self-pay

## 2022-12-20 ENCOUNTER — Other Ambulatory Visit (HOSPITAL_COMMUNITY): Payer: Self-pay

## 2023-02-01 DIAGNOSIS — H2513 Age-related nuclear cataract, bilateral: Secondary | ICD-10-CM | POA: Diagnosis not present

## 2023-02-01 DIAGNOSIS — H04123 Dry eye syndrome of bilateral lacrimal glands: Secondary | ICD-10-CM | POA: Diagnosis not present

## 2023-02-01 DIAGNOSIS — H53451 Other localized visual field defect, right eye: Secondary | ICD-10-CM | POA: Diagnosis not present

## 2023-02-01 DIAGNOSIS — H43823 Vitreomacular adhesion, bilateral: Secondary | ICD-10-CM | POA: Diagnosis not present

## 2023-03-07 DIAGNOSIS — H2513 Age-related nuclear cataract, bilateral: Secondary | ICD-10-CM | POA: Diagnosis not present

## 2023-03-07 DIAGNOSIS — H04123 Dry eye syndrome of bilateral lacrimal glands: Secondary | ICD-10-CM | POA: Diagnosis not present

## 2023-03-07 DIAGNOSIS — H43823 Vitreomacular adhesion, bilateral: Secondary | ICD-10-CM | POA: Diagnosis not present

## 2023-03-07 DIAGNOSIS — H53451 Other localized visual field defect, right eye: Secondary | ICD-10-CM | POA: Diagnosis not present

## 2023-03-09 DIAGNOSIS — H9011 Conductive hearing loss, unilateral, right ear, with unrestricted hearing on the contralateral side: Secondary | ICD-10-CM | POA: Diagnosis not present

## 2023-05-10 DIAGNOSIS — H6123 Impacted cerumen, bilateral: Secondary | ICD-10-CM | POA: Diagnosis not present

## 2023-06-07 DIAGNOSIS — H43823 Vitreomacular adhesion, bilateral: Secondary | ICD-10-CM | POA: Diagnosis not present

## 2023-06-07 DIAGNOSIS — H53451 Other localized visual field defect, right eye: Secondary | ICD-10-CM | POA: Diagnosis not present

## 2023-06-08 ENCOUNTER — Other Ambulatory Visit: Payer: Self-pay | Admitting: Family

## 2023-06-08 DIAGNOSIS — Z1231 Encounter for screening mammogram for malignant neoplasm of breast: Secondary | ICD-10-CM

## 2023-06-12 ENCOUNTER — Ambulatory Visit
Admission: RE | Admit: 2023-06-12 | Discharge: 2023-06-12 | Disposition: A | Discharge status: Home / Self Care | Payer: Commercial Managed Care - PPO | Source: Ambulatory Visit | Attending: Family | Admitting: Family

## 2023-06-12 ENCOUNTER — Ambulatory Visit (INDEPENDENT_AMBULATORY_CARE_PROVIDER_SITE_OTHER): Payer: Commercial Managed Care - PPO | Admitting: Podiatry

## 2023-06-12 DIAGNOSIS — M722 Plantar fascial fibromatosis: Secondary | ICD-10-CM

## 2023-06-12 DIAGNOSIS — Z1231 Encounter for screening mammogram for malignant neoplasm of breast: Secondary | ICD-10-CM

## 2023-06-12 MED ORDER — METHYLPREDNISOLONE 4 MG PO TBPK: ORAL_TABLET | ORAL | 0 refills | Status: DC

## 2023-06-12 NOTE — Progress Notes (Signed)
   No chief complaint on file.   Subjective: 57 y.o. female presenting today for follow up evaluation of chronic plantar fasciitis to the bilateral feet.  Patient states that orthotics have traditionally helped significantly but slowly over time she has developed some new pain and tenderness associated to the bilateral feet.  Last seen in the office 11/17/2021.  She is requesting new orthotics today.   Past Medical History:  Diagnosis Date   Arthritis    Family history of adverse reaction to anesthesia    mom has ponv   GERD (gastroesophageal reflux disease)    Past Surgical History:  Procedure Laterality Date   ESOPHAGOGASTRODUODENOSCOPY (EGD) WITH PROPOFOL N/A 07/02/2021   Procedure: ESOPHAGOGASTRODUODENOSCOPY (EGD) WITH PROPOFOL;  Surgeon: Jeani Hawking, MD;  Location: [facility];  Service: Endoscopy;  Laterality: N/A;   LIPOMA EXCISION Left 08/17/2016   Procedure: EXCISION OF SUBCUTANEOUS LIPOMA LEFT HIP;  Surgeon: Manus Rudd, MD;  Location: [facility];  Service: General;  Laterality: Left;   UPPER ESOPHAGEAL ENDOSCOPIC ULTRASOUND (EUS) N/A 07/02/2021   Procedure: UPPER ESOPHAGEAL ENDOSCOPIC ULTRASOUND (EUS);  Surgeon: Jeani Hawking, MD;  Location: [facility];  Service: Endoscopy;  Laterality: N/A;   Allergies  Allergen Reactions   Compazine [Prochlorperazine Edisylate] Other (See Comments)    angioedema   Flexeril [Cyclobenzaprine] Other (See Comments)    Severe confusion   Prochlorperazine Other (See Comments)   Reglan [Metoclopramide] Other (See Comments)    Angioedema      Objective: Physical Exam General: The patient is alert and oriented x3 in no acute distress.  Dermatology: Skin is warm, dry and supple bilateral lower extremities. Negative for open lesions or macerations bilateral.   Vascular: Dorsalis Pedis and Posterior Tibial pulses palpable bilateral.  Capillary fill time is immediate to all digits.  Neurological: Gross intact via light  touch  Musculoskeletal: Chronic tenderness to palpation to the plantar aspect of the bilateral heels along the plantar fascia. All other joints range of motion within normal limits bilateral. Strength 5/5 in all groups bilateral.   Assessment: 1. plantar fasciitis bilateral feet  Plan of Care:  -Patient evaluated -Patient declined injections today -Prescription for Medrol Dosepak, then resume Motrin 800 mg PRN -Continue on her current orthotics that she wears now.  Appointment with orthotics department for new custom molded orthotics.  Order placed -Return to clinic with me as needed.  *ICU nurse at [facility]  Felecia Shelling, DPM [facility]  Dr. Felecia Shelling, DPM    [address] [place], [place] [address]                Office [phone]  Fax [phone]

## 2023-06-13 ENCOUNTER — Ambulatory Visit (INDEPENDENT_AMBULATORY_CARE_PROVIDER_SITE_OTHER): Payer: Commercial Managed Care - PPO

## 2023-06-13 ENCOUNTER — Telehealth: Payer: Self-pay | Admitting: Podiatry

## 2023-06-13 DIAGNOSIS — M722 Plantar fascial fibromatosis: Secondary | ICD-10-CM

## 2023-06-13 NOTE — Telephone Encounter (Signed)
Pt was taken care of

## 2023-06-13 NOTE — Progress Notes (Signed)
Orthotic eval   Patient was seen, measured for custom molded foot orthotics  Patient will benefit from CFO's as they will help provide total contact to MLA's helping to better distribute body weight across BIL feet greater reducing plantar pressure and pain and to also encourage FF and RF alignment  Patient was scanned items to be ordered and fit when in  [employer], CFo, CFm

## 2023-06-21 DIAGNOSIS — Z13220 Encounter for screening for lipoid disorders: Secondary | ICD-10-CM | POA: Diagnosis not present

## 2023-06-21 DIAGNOSIS — Z01419 Encounter for gynecological examination (general) (routine) without abnormal findings: Secondary | ICD-10-CM | POA: Diagnosis not present

## 2023-06-21 DIAGNOSIS — Z130 Encounter for screening for diseases of the blood and blood-forming organs and certain disorders involving the immune mechanism: Secondary | ICD-10-CM | POA: Diagnosis not present

## 2023-06-21 DIAGNOSIS — Z1329 Encounter for screening for other suspected endocrine disorder: Secondary | ICD-10-CM | POA: Diagnosis not present

## 2023-06-21 DIAGNOSIS — Z0000 Encounter for general adult medical examination without abnormal findings: Secondary | ICD-10-CM | POA: Diagnosis not present

## 2023-06-21 DIAGNOSIS — Z131 Encounter for screening for diabetes mellitus: Secondary | ICD-10-CM | POA: Diagnosis not present

## 2023-06-23 ENCOUNTER — Telehealth: Payer: Self-pay | Admitting: Podiatry

## 2023-06-23 NOTE — Telephone Encounter (Signed)
 Lmom for pt to call back to set up appt to pick up orthotics    Balance pending insurance

## 2023-06-29 ENCOUNTER — Other Ambulatory Visit: Payer: Self-pay

## 2023-06-29 ENCOUNTER — Other Ambulatory Visit (HOSPITAL_COMMUNITY): Payer: Self-pay

## 2023-06-29 DIAGNOSIS — R140 Abdominal distension (gaseous): Secondary | ICD-10-CM | POA: Diagnosis not present

## 2023-06-29 DIAGNOSIS — K5904 Chronic idiopathic constipation: Secondary | ICD-10-CM | POA: Diagnosis not present

## 2023-06-29 DIAGNOSIS — K219 Gastro-esophageal reflux disease without esophagitis: Secondary | ICD-10-CM | POA: Diagnosis not present

## 2023-06-29 MED ORDER — PANTOPRAZOLE SODIUM 40 MG PO TBEC
40.0000 mg | DELAYED_RELEASE_TABLET | Freq: Every day | ORAL | 4 refills | Status: AC
  Filled 2023-06-29: qty 90, 90d supply, fill #0
  Filled 2023-09-21: qty 90, 90d supply, fill #1
  Filled 2023-12-22: qty 90, 90d supply, fill #2
  Filled 2024-04-08: qty 90, 90d supply, fill #3

## 2023-06-29 MED ORDER — FAMOTIDINE 40 MG PO TABS
40.0000 mg | ORAL_TABLET | Freq: Two times a day (BID) | ORAL | 4 refills | Status: AC
  Filled 2023-06-29: qty 180, 90d supply, fill #0
  Filled 2023-09-21: qty 180, 90d supply, fill #1
  Filled 2023-12-22: qty 180, 90d supply, fill #2
  Filled 2024-04-08: qty 180, 90d supply, fill #3

## 2023-06-30 ENCOUNTER — Other Ambulatory Visit (HOSPITAL_COMMUNITY): Payer: Self-pay

## 2023-07-05 ENCOUNTER — Other Ambulatory Visit (HOSPITAL_COMMUNITY): Payer: Self-pay

## 2023-07-12 ENCOUNTER — Ambulatory Visit: Payer: Commercial Managed Care - PPO

## 2023-07-19 ENCOUNTER — Other Ambulatory Visit (HOSPITAL_BASED_OUTPATIENT_CLINIC_OR_DEPARTMENT_OTHER): Payer: Self-pay

## 2023-07-19 MED ORDER — COVID-19 MRNA VAC-TRIS(PFIZER) 30 MCG/0.3ML IM SUSY
0.3000 mL | PREFILLED_SYRINGE | Freq: Once | INTRAMUSCULAR | 0 refills | Status: AC
  Filled 2023-07-19: qty 0.3, 1d supply, fill #0

## 2023-07-19 MED ORDER — INFLUENZA VIRUS VACC SPLIT PF (FLUZONE) 0.5 ML IM SUSY
0.5000 mL | PREFILLED_SYRINGE | Freq: Once | INTRAMUSCULAR | 0 refills | Status: AC
  Filled 2023-07-19: qty 0.5, 1d supply, fill #0

## 2023-07-19 NOTE — Progress Notes (Addendum)
Patient presents today to pick up custom molded foot orthotics, diagnosed with PF by Dr. Logan Bores .   Orthotics were dispensed and fit was satisfactory. Reviewed instructions for break-in and wear. Written instructions given to patient.  Patient will follow up as needed.  [name] CPed, CFo, CFm

## 2023-08-31 ENCOUNTER — Telehealth: Payer: Self-pay | Admitting: Family

## 2023-09-18 ENCOUNTER — Other Ambulatory Visit: Payer: Self-pay

## 2023-09-18 ENCOUNTER — Other Ambulatory Visit (HOSPITAL_COMMUNITY): Payer: Self-pay

## 2023-09-18 MED ORDER — HYDROCODONE-ACETAMINOPHEN 7.5-325 MG PO TABS
0.5000 | ORAL_TABLET | ORAL | 0 refills | Status: DC
Start: 2023-09-18 — End: 2024-01-17
  Filled 2023-09-18: qty 6, 1d supply, fill #0

## 2023-09-18 MED ORDER — KETOROLAC TROMETHAMINE 10 MG PO TABS
10.0000 mg | ORAL_TABLET | Freq: Four times a day (QID) | ORAL | 0 refills | Status: DC
  Filled 2023-09-18: qty 16, 4d supply, fill #0

## 2023-09-21 ENCOUNTER — Other Ambulatory Visit (HOSPITAL_COMMUNITY): Payer: Self-pay

## 2023-09-21 ENCOUNTER — Encounter (HOSPITAL_COMMUNITY): Payer: Self-pay

## 2023-09-21 ENCOUNTER — Other Ambulatory Visit: Payer: Self-pay

## 2023-09-21 MED ORDER — DEXAMETHASONE 2 MG PO TABS
2.0000 mg | ORAL_TABLET | Freq: Four times a day (QID) | ORAL | 0 refills | Status: DC | PRN
Start: 2023-09-21 — End: 2024-01-17
  Filled 2023-09-21 (×2): qty 6, 2d supply, fill #0

## 2023-11-21 ENCOUNTER — Telehealth: Payer: Self-pay | Admitting: Family

## 2023-11-21 NOTE — Telephone Encounter (Signed)
Yes, I would be happy to see her.

## 2023-11-21 NOTE — Telephone Encounter (Signed)
[name] is [name] (09/16/1938) daughter and she is wanting to be a new patient of yours. Would this be okay?

## 2023-11-23 ENCOUNTER — Telehealth (INDEPENDENT_AMBULATORY_CARE_PROVIDER_SITE_OTHER): Payer: Self-pay | Admitting: Otolaryngology

## 2023-11-23 NOTE — Telephone Encounter (Signed)
Called and left voicemail to confirm appointment and address

## 2023-11-24 ENCOUNTER — Ambulatory Visit (INDEPENDENT_AMBULATORY_CARE_PROVIDER_SITE_OTHER): Payer: 59 | Admitting: Otolaryngology

## 2023-11-24 ENCOUNTER — Encounter (INDEPENDENT_AMBULATORY_CARE_PROVIDER_SITE_OTHER): Payer: Self-pay

## 2023-11-24 ENCOUNTER — Ambulatory Visit (INDEPENDENT_AMBULATORY_CARE_PROVIDER_SITE_OTHER): Payer: Self-pay | Admitting: Audiology

## 2023-11-24 VITALS — BP 127/77 | Ht 70.0 in | Wt 175.0 lb

## 2023-11-24 DIAGNOSIS — H9011 Conductive hearing loss, unilateral, right ear, with unrestricted hearing on the contralateral side: Secondary | ICD-10-CM

## 2023-11-24 DIAGNOSIS — H6123 Impacted cerumen, bilateral: Secondary | ICD-10-CM

## 2023-11-24 NOTE — Progress Notes (Signed)
Patient ID: [name], female   DOB: 1965-11-06, 58 y.o.   MRN: [id number]  Follow-up: Right ear hearing loss, recurrent cerumen impaction  HPI: The patient is a 58 year old female who returns today for her follow-up evaluation.  The patient was last seen 6 months ago for her right ear hearing loss and recurrent cerumen impaction.  The patient has a history of a Q-tip accident many years ago, resulting in a right tympanic membrane perforation and right ear hearing loss.  The perforation has healed.  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  The patient was fitted with a right ear hearing aid.  The patient returns today complaining of clogging sensation in the ears.  She denies any otalgia, otorrhea, or change in her hearing.  Exam: General: Communicates without difficulty, well nourished, no acute distress. Head: Normocephalic, no evidence injury, no tenderness, facial buttresses intact without stepoff. Face/sinus: No tenderness to palpation and percussion. Facial movement is normal and symmetric. Eyes: PERRL, EOMI. No scleral icterus, conjunctivae clear. Neuro: CN II exam reveals vision grossly intact.  No nystagmus at any point of gaze. EAC: Bilateral cerumen impaction.  Under the operating microscope, the cerumen is carefully removed with a combination of cerumen currette, alligator forceps, and suction catheters.  After the cerumen is removed, the TMs are noted to be normal. Nose: External evaluation reveals normal support and skin without lesions.  Dorsum is intact.  Anterior rhinoscopy reveals pink mucosa over anterior aspect of inferior turbinates and intact septum.  No purulence noted. Oral:  Oral cavity and oropharynx are intact, symmetric, without erythema or edema.  Mucosa is moist without lesions. Neck: Full range of motion without pain.  There is no significant lymphadenopathy.  No masses palpable.  Thyroid bed within normal limits to palpation.  Parotid glands and  submandibular glands equal bilaterally without mass.  Trachea is midline. Neuro:  CN 2-12 grossly intact. Gait normal.   Assessment  1.  Bilateral cerumen impaction.  After the disimpaction procedure, both tympanic membranes and middle ear spaces are noted to be normal. No tympanic membrane perforation is noted today. 2.  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   Plan  1.  Otomicroscopy with bilateral cerumen disimpaction.  2.  The physical exam findings are reviewed with the patient.  3.  Continue the use of her right ear hearing aid. 4.  The patient will return for re-evaluation in 6 months with repeat hearing test.

## 2023-11-24 NOTE — Progress Notes (Signed)
  [address], [address] [place], [place] [address] [phone]  Hearing Aid Check     [name] comes for a scheduled appointment with Dr. Suszanne Conners and asked to have her hearing aids checked while she was in the clinic.         Right  Hearing aid manufacturer [employer] 3 ITC  SN: [id number]  Hearing aid style Full shell custom hearing aid  Hearing aid battery 312 or 13  Receiver 89  Retention wire Canal lock  Warranty expiration date 03-22-2026  Loss and Damage 03-22-2026  Initial fitting date 03-09-2023  Device was fit at: Dr. Avel [facility] clinic    Chief complaint: Patient reports hearing aid's sounds are not crisp.  Actions taken: Inspection of the device and listening check showed that te wax filter was clogged with wax. Patient was unsure if it was crisp, but maybe better. Microphones were cleaned as well.   Services fee: $0 was paid at checkout.  Patient was oriented about returning for a hearing aid check if lack of clarity persisted. I would prefer to run [employer] [facility] [employer] for her, if the equipment is available by then.  Recommend: Return for a hearing aid check as needed. Patient decided to schedule one at her following 6 month visit for ear cleaning with Dr. Suszanne Conners. Return for a hearing evaluation and to see an ENT, if concerns with hearing changes arise.    [name] MARIE LEROUX-MARTINEZ, AUD

## 2023-12-22 ENCOUNTER — Other Ambulatory Visit (HOSPITAL_COMMUNITY): Payer: Self-pay

## 2024-01-17 ENCOUNTER — Ambulatory Visit: Payer: Commercial Managed Care - PPO | Admitting: Nurse Practitioner

## 2024-01-17 ENCOUNTER — Encounter: Payer: Self-pay | Admitting: Nurse Practitioner

## 2024-01-17 VITALS — BP 132/84 | HR 56 | Ht 68.5 in | Wt 174.4 lb

## 2024-01-17 DIAGNOSIS — R413 Other amnesia: Secondary | ICD-10-CM | POA: Diagnosis not present

## 2024-01-17 DIAGNOSIS — K219 Gastro-esophageal reflux disease without esophagitis: Secondary | ICD-10-CM | POA: Diagnosis not present

## 2024-01-17 DIAGNOSIS — Z1329 Encounter for screening for other suspected endocrine disorder: Secondary | ICD-10-CM | POA: Diagnosis not present

## 2024-01-17 DIAGNOSIS — M5136 Other intervertebral disc degeneration, lumbar region with discogenic back pain only: Secondary | ICD-10-CM | POA: Diagnosis not present

## 2024-01-17 DIAGNOSIS — H9011 Conductive hearing loss, unilateral, right ear, with unrestricted hearing on the contralateral side: Secondary | ICD-10-CM | POA: Diagnosis not present

## 2024-01-17 DIAGNOSIS — N951 Menopausal and female climacteric states: Secondary | ICD-10-CM | POA: Diagnosis not present

## 2024-01-17 DIAGNOSIS — Z13228 Encounter for screening for other metabolic disorders: Secondary | ICD-10-CM | POA: Diagnosis not present

## 2024-01-17 DIAGNOSIS — R7303 Prediabetes: Secondary | ICD-10-CM

## 2024-01-17 DIAGNOSIS — Z0000 Encounter for general adult medical examination without abnormal findings: Secondary | ICD-10-CM

## 2024-01-17 NOTE — Assessment & Plan Note (Signed)

## 2024-01-17 NOTE — Progress Notes (Signed)
 Kellie Eth, DNP, AGNP-c [facility] [address] [place], [place] [address] Main Office [phone]   New patient visit   Patient: [name]   DOB: 01-03-1966   58 y.o. Female  MRN: [id number] Visit Date: 01/17/2024  Patient Care Team: Kellie Eth, NP as PCP - General (Nurse Practitioner)  Today's Vitals   01/17/24 0930  BP: 132/84  Pulse: (!) 56  Weight: 174 lb 6.4 oz (79.1 kg)  Height: 5' 8.5" (1.74 m)   Body mass index is 26.13 kg/m.   Today's healthcare provider: Tollie Eth, NP   Chief Complaint  Patient presents with   other    New pt. Est., no other issues, see's Dr. Cherly Evans obgyn, had PAP 2024   Subjective    [name] is a 58 y.o. female who presents today as a new patient to establish care.   History of Present Illness [name] is a 58 year old female who presents to establish care and for an annual physical exam.  She missed her annual physical exam last year due to scheduling difficulties and insurance changes. She is ensuring she meets her insurance requirements for a physical. No chest pain, palpitations, shortness of breath, dizziness, or swelling in feet or ankles.  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 She recently got a hearing aid and is pleased with the improvement in her hearing.  She experiences occasional 'splotchy gray spots' in her vision, particularly when looking at a computer or paper. She has been evaluated by Dr. Jimmey Evans, who identified a fluid pocket under the retina. No major vision changes.  She takes famotidine twice daily and pantoprazole once daily for reflux symptoms. She reports 'bubbling and gurgling' in her stomach but no significant reflux symptoms at night. She also takes Colace for constipation, which has improved her bowel regularity. No changes in bowel or bladder  habits. She sees Dr. Loreta Evans for GI management and is up to date on her colonoscopy.   She has a history of degenerative disc disease, which causes back pain when she overexerts herself. She uses a soft brace for support, which helps alleviate the discomfort. She denies any new or concerning symptoms.   She is postmenopausal and experiences occasional hot flashes. She notes weight gain since menopause and is working on increasing her physical activity with exercise with her daughter [name].  She has a family history of dementia and Alzheimer's disease, with her mother exhibiting memory concerns and previously her grandmother affected. She experiences some forgetfulness and is concerned about her memory, especially given her family history. She would like to have evaluation for that, if possible.  History reviewed and reveals the following: Past Medical History:  Diagnosis Date   Arthritis    Family history of adverse reaction to anesthesia    mom has ponv   GERD (gastroesophageal reflux disease)    Past Surgical History:  Procedure Laterality Date   ESOPHAGOGASTRODUODENOSCOPY (EGD) WITH PROPOFOL N/A 07/02/2021   Procedure: ESOPHAGOGASTRODUODENOSCOPY (EGD) WITH PROPOFOL;  Surgeon: Jeani Hawking, MD;  Location: [facility];  Service: Endoscopy;  Laterality: N/A;   LIPOMA EXCISION Left 08/17/2016   Procedure: EXCISION OF SUBCUTANEOUS LIPOMA LEFT HIP;  Surgeon: Manus Rudd, MD;  Location: [facility];  Service: General;  Laterality: Left;   UPPER ESOPHAGEAL ENDOSCOPIC ULTRASOUND (EUS) N/A 07/02/2021  Procedure: UPPER ESOPHAGEAL ENDOSCOPIC ULTRASOUND (EUS);  Surgeon: Jeani Hawking, MD;  Location: [facility];  Service: Endoscopy;  Laterality: N/A;   Family Status  Relation Name Status   Mother  (Not Specified)  No partnership data on file   Family History  Problem Relation Age of Onset   Breast cancer Mother    Social History   Socioeconomic History   Marital status:  Married    Spouse name: Not on file   Number of children: Not on file   Years of education: Not on file   Highest education level: Not on file  Occupational History   Not on file  Tobacco Use   Smoking status: Never    Passive exposure: Never   Smokeless tobacco: Never  Substance and Sexual Activity   Alcohol use: Yes   Drug use: No   Sexual activity: Not on file  Other Topics Concern   Not on file  Social History Narrative   Not on file   Social Drivers of Health   Financial Resource Strain: Not on file  Food Insecurity: Not on file  Transportation Needs: Not on file  Physical Activity: Not on file  Stress: Not on file  Social Connections: Not on file   Outpatient Medications Prior to Visit  Medication Sig Note   acetaminophen (TYLENOL) 500 MG tablet Take 1,000 mg by mouth every 6 (six) hours as needed for moderate pain.    Apoaequorin (PREVAGEN PO) Take 1 tablet by mouth daily.    calcium carbonate (TUMS - DOSED IN MG ELEMENTAL CALCIUM) 500 MG chewable tablet Chew 4 tablets by mouth daily.    cholecalciferol (VITAMIN D) 25 MCG (1000 UNIT) tablet Take 1,000 Units by mouth in the morning and at bedtime.    diclofenac Sodium (VOLTAREN) 1 % GEL Apply 1 application topically daily as needed (pain).    docusate sodium (COLACE) 100 MG capsule Take 100 mg by mouth 2 (two) times daily.    famotidine (PEPCID) 40 MG tablet Take 1 tablet (40 mg total) by mouth 2 (two) times daily.    ibuprofen (ADVIL) 200 MG tablet Take 400-600 mg by mouth every 6 (six) hours as needed for moderate pain.    pantoprazole (PROTONIX) 40 MG tablet Take 1 tablet (40 mg total) by mouth daily 20 minutes before breakfast.    Turmeric (QC TUMERIC COMPLEX) 500 MG CAPS Take 500 mg by mouth daily.    vitamin E 180 MG (400 UNITS) capsule Take 400 Units by mouth daily.    [DISCONTINUED] baclofen (LIORESAL) 10 MG tablet Take 0.5-1 tablets (5-10 mg total) by mouth at bedtime as needed for muscle spasms. (Patient not  taking: Reported on 01/17/2024) 01/17/2024: prn   [DISCONTINUED] COVID-19 mRNA Vac-TriS, Pfizer, (COMIRNATY) SUSP injection Inject into the muscle. (Patient not taking: Reported on 01/17/2024)    [DISCONTINUED] dexamethasone (DECADRON) 2 MG tablet Take 1 tablet (2 mg total) by mouth every 6-8 hours as needed for severe pain (Patient not taking: Reported on 01/17/2024)    [DISCONTINUED] famotidine (PEPCID) 40 MG tablet Take 1 tablet (40 mg total) by mouth 2 (two) times daily. (Patient not taking: Reported on 01/17/2024)    [DISCONTINUED] HYDROcodone-acetaminophen (NORCO) 7.5-325 MG tablet Take 1/2-1 tablet by mouth every 4-6 hours as needed for severe pain    [DISCONTINUED] ketorolac (TORADOL) 10 MG tablet Take 1 tablet (10 mg total) by mouth 4 (four) times daily. Start six hours after IV injection. IV injection will be given in office prior to  surgery. (Patient not taking: Reported on 01/17/2024)    [DISCONTINUED] methylPREDNISolone (MEDROL DOSEPAK) 4 MG TBPK tablet 6 day dose pack - take as directed (Patient not taking: Reported on 01/17/2024)    [DISCONTINUED] pantoprazole (PROTONIX) 40 MG tablet Take 1 tablet (40 mg total) by mouth daily. (Patient not taking: Reported on 01/17/2024)    [DISCONTINUED] Sod Picosulfate-Mag Ox-Cit Acd (CLENPIQ) 10-3.5-12 MG-GM -GM/160ML SOLN Take by mouth as directed per office and not instructions on packaging (Patient not taking: Reported on 01/17/2024)    No facility-administered medications prior to visit.   Allergies  Allergen Reactions   Compazine [Prochlorperazine Edisylate] Other (See Comments)    angioedema   Flexeril [Cyclobenzaprine] Other (See Comments)    Severe confusion   Prochlorperazine Other (See Comments)   Reglan [Metoclopramide] Other (See Comments)    Angioedema    Immunization History  Administered Date(s) Administered   Influenza, Seasonal, Injecte, Preservative Fre 07/19/2023   PFIZER Comirnaty(Gray Top)Covid-19 Tri-Sucrose Vaccine 08/15/2022    Pfizer Covid-19 Vaccine Bivalent Booster 3yrs & up 07/30/2021   Pfizer(Comirnaty)Fall Seasonal Vaccine 12 years and older 07/19/2023   Tdap 05/19/2021   Zoster Recombinant(Shingrix) 05/19/2021, 07/21/2021    Health Maintenance Due Health Maintenance Topics with due status: Overdue     Topic Date Due   Cervical Cancer Screening (HPV/Pap Cotest) Never done    Review of Systems All review of systems negative except what is listed in the HPI   Objective    BP 132/84   Pulse (!) 56   Ht 5' 8.5" (1.74 m)   Wt 174 lb 6.4 oz (79.1 kg)   BMI 26.13 kg/m  Physical Exam Vitals and nursing note reviewed.  Constitutional:      General: She is not in acute distress.    Appearance: Normal appearance.  HENT:     Head: Normocephalic and atraumatic.     Right Ear: Hearing, tympanic membrane, ear canal and external ear normal.     Left Ear: Hearing, tympanic membrane, ear canal and external ear normal.     Nose: Nose normal.     Right Sinus: No maxillary sinus tenderness or frontal sinus tenderness.     Left Sinus: No maxillary sinus tenderness or frontal sinus tenderness.     Mouth/Throat:     Lips: Pink.     Mouth: Mucous membranes are moist.     Pharynx: Oropharynx is clear.  Eyes:     General: Lids are normal. Vision grossly intact.     Extraocular Movements: Extraocular movements intact.     Conjunctiva/sclera: Conjunctivae normal.     Pupils: Pupils are equal, round, and reactive to light.     Funduscopic exam:    Right eye: Red reflex present.        Left eye: Red reflex present.    Visual Fields: Right eye visual fields normal and left eye visual fields normal.  Neck:     Thyroid: No thyromegaly.     Vascular: No carotid bruit.  Cardiovascular:     Rate and Rhythm: Normal rate and regular rhythm.     Chest Wall: PMI is not displaced.     Pulses: Normal pulses.          Dorsalis pedis pulses are 2+ on the right side and 2+ on the left side.       Posterior tibial  pulses are 2+ on the right side and 2+ on the left side.     Heart sounds: Normal heart sounds. No  murmur heard. Pulmonary:     Effort: Pulmonary effort is normal. No respiratory distress.     Breath sounds: Normal breath sounds.  Abdominal:     General: Abdomen is flat. Bowel sounds are normal. There is no distension.     Palpations: Abdomen is soft. There is no hepatomegaly, splenomegaly or mass.     Tenderness: There is no abdominal tenderness. There is no right CVA tenderness, left CVA tenderness, guarding or rebound.  Musculoskeletal:        General: Normal range of motion.     Cervical back: Full passive range of motion without pain, normal range of motion and neck supple. No tenderness.     Right lower leg: No edema.     Left lower leg: No edema.  Feet:     Left foot:     Toenail Condition: Left toenails are normal.  Lymphadenopathy:     Cervical: No cervical adenopathy.     Upper Body:     Right upper body: No supraclavicular adenopathy.     Left upper body: No supraclavicular adenopathy.  Skin:    General: Skin is warm and dry.     Capillary Refill: Capillary refill takes less than 2 seconds.     Nails: There is no clubbing.  Neurological:     General: No focal deficit present.     Mental Status: She is alert and oriented to person, place, and time.     GCS: GCS eye subscore is 4. GCS verbal subscore is 5. GCS motor subscore is 6.     Sensory: Sensation is intact.     Motor: Motor function is intact.     Coordination: Coordination is intact.     Gait: Gait is intact.     Deep Tendon Reflexes: Reflexes are normal and symmetric.  Psychiatric:        Attention and Perception: Attention normal.        Mood and Affect: Mood normal.        Speech: Speech normal.        Behavior: Behavior normal. Behavior is cooperative.        Thought Content: Thought content normal.        Cognition and Memory: Cognition and memory normal.        Judgment: Judgment normal.      No  results found for any visits on 01/17/24.  Assessment & Plan      Problem List Items Addressed This Visit     Prediabetes   Historic diagnosis with no alarm symptoms present. Managed with diet and exercise. Will obtain labs today to ensure that this is not causing her to have weight gain.  - Labs today      Relevant Orders   Hemoglobin A1c   CBC with Differential/Platelet   Comprehensive metabolic panel   Lipid panel   Conductive hearing loss in right ear   Chronic right ear hearing loss with approximately 50% capacity, likely due to childhood eardrum rupture from a Q-tip. Recently fitted with a Bluetooth-enabled inner ear hearing aid, significantly improving hearing and preferred over the behind-the-ear model due to mask interference. No current bubbling or fluid sounds. - Follow-up with ENT as directed      Encounter for medical examination to establish care - Primary   CPE completed today. Review of HM activities and recommendations discussed and provided on AVS. Anticipatory guidance, diet, and exercise recommendations provided. Medications, allergies, and hx reviewed and updated as necessary. Orders placed  as listed below.  Plan: - Labs ordered. Will make changes as necessary based on results.  - I will review these results and send recommendations via MyChart or a telephone call.  - F/U with CPE in 1 year or sooner for acute/chronic health needs as directed.        Memory changes   Concerns about forgetfulness and memory changes that feel increased for her age. Family history of dementia. Interested in neuro evaluation for cognitive function assessment. Discussed benefits of [facility] evaluation and potential interventions for cognitive decline. - Refer for evaluation - She wishes to see Dr. Lucia Gaskins due to relationship with her mother      Relevant Orders   Ambulatory referral to Neurology   Vitamin B12   TSH   T4, free   Gastroesophageal reflux disease   Chronic GERD  managed with famotidine and pantoprazole. Reports persistent stomach bubbling and gurgling, but no significant nocturnal reflux symptoms. Constipation managed with Colace, improving bowel regularity. - Continue famotidine and pantoprazole - Continue Colace for constipation      Degeneration of intervertebral disc of lumbar region with discogenic back pain   Chronic back pain due to degenerative disc disease, managed with a soft brace. No significant issues requiring further intervention currently. Discussed potential symptom progression and benefits of exercise. - Continue using a soft brace as needed - Consider referral to orthopedics or physical therapy if symptoms worsen      Menopausal symptoms   Experiencing menopausal symptoms, including weight gain and hot flashes. Discussed weight management challenges during menopause and hormonal impact. Considered phentermine and GLP-1 agonists for weight management, noting insurance coverage limitations. - Encourage regular exercise and strength training - Monitor thyroid function and B12 levels      Other Visit Diagnoses       Thyroid disorder screen       Relevant Orders   TSH   T4, free     Screening for metabolic disorder       Relevant Orders   Hemoglobin A1c   Vitamin B12   CBC with Differential/Platelet   Comprehensive metabolic panel   TSH   T4, free   Lipid panel        Return in about 1 year (around 01/16/2025) for CPE.      Barth Trella, Sung Amabile, NP, DNP, AGNP-C [facility]

## 2024-01-17 NOTE — Assessment & Plan Note (Signed)
 Chronic back pain due to degenerative disc disease, managed with a soft brace. No significant issues requiring further intervention currently. Discussed potential symptom progression and benefits of exercise. - Continue using a soft brace as needed - Consider referral to orthopedics or physical therapy if symptoms worsen

## 2024-01-17 NOTE — Assessment & Plan Note (Signed)
 Chronic GERD managed with famotidine and pantoprazole. Reports persistent stomach bubbling and gurgling, but no significant nocturnal reflux symptoms. Constipation managed with Colace, improving bowel regularity. - Continue famotidine and pantoprazole - Continue Colace for constipation

## 2024-01-17 NOTE — Assessment & Plan Note (Signed)
 Experiencing menopausal symptoms, including weight gain and hot flashes. Discussed weight management challenges during menopause and hormonal impact. Considered phentermine and GLP-1 agonists for weight management, noting insurance coverage limitations. - Encourage regular exercise and strength training - Monitor thyroid function and B12 levels

## 2024-01-17 NOTE — Assessment & Plan Note (Signed)
 Historic diagnosis with no alarm symptoms present. Managed with diet and exercise. Will obtain labs today to ensure that this is not causing her to have weight gain.  - Labs today

## 2024-01-17 NOTE — Assessment & Plan Note (Signed)
 Concerns about forgetfulness and memory changes that feel increased for her age. Family history of dementia. Interested in neuro evaluation for cognitive function assessment. Discussed benefits of [facility] evaluation and potential interventions for cognitive decline. - Refer for evaluation - She wishes to see Dr. Lucia Gaskins due to relationship with her mother

## 2024-01-17 NOTE — Assessment & Plan Note (Signed)
 Chronic right ear hearing loss with approximately 50% capacity, likely due to childhood eardrum rupture from a Q-tip. Recently fitted with a Bluetooth-enabled inner ear hearing aid, significantly improving hearing and preferred over the behind-the-ear model due to mask interference. No current bubbling or fluid sounds. - Follow-up with ENT as directed

## 2024-01-17 NOTE — Patient Instructions (Addendum)
 I am so happy to see you today!  Labs today: CBC: Red and white blood cell counts CMP: Current blood sugar, electrolytes, kidney function, liver function, and proteins. Lipid: Cholesterol A1c: Average blood sugar over the last 3 months TSH/T4: Thyroid B12: Vitamin B12  Medications: If you need medication refills please let us know and I am happy to send that in.    WEIGHT LOSS PLANNING Your progress today shows:     01/17/2024    9:30 AM 11/24/2023    9:29 AM 06/24/2022    1:56 PM  Vitals with BMI  Height 5' 8.5" 5\' 10"  5' 8.11"  Weight 174 lbs 6 oz 175 lbs   BMI 26.13 25.11   Systolic 132 127 540  Diastolic 84 77 80  Pulse 56  80    For best management of weight, it is vital to balance intake versus output. This means the number of calories burned per day must be less than the calories you take in with food and drink.   I recommend trying to follow a diet with the following: Calories: 1200-1500 calories per day Carbohydrates: 150-180 grams of carbohydrates per day  Why: Gives your body enough "quick fuel" for cells to maintain normal function without sending them into starvation mode.  Protein: At least 90 grams of protein per day- 30 grams with each meal Why: Protein takes longer and uses more energy than carbohydrates to break down for fuel. The carbohydrates in your meals serves as quick energy sources and proteins help use some of that extra quick energy to break down to produce long term energy. This helps you not feel hungry as quickly and protein breakdown burns calories.  Water: Drink AT LEAST 64 ounces of water per day  Why: Water is essential to healthy metabolism. Water helps to fill the stomach and keep you fuller longer. Water is required for healthy digestion and filtering of waste in the body.  Fat: Limit fats in your diet- when choosing fats, choose foods with lower fats content such as lean meats (chicken, fish, [place]).  Why: Increased fat intake leads to  storage "for later". Once you burn your carbohydrate energy, your body goes into fat and protein breakdown mode to help you loose weight.  Cholesterol: Fats and oils that are LIQUID at room temperature are best. Choose vegetable oils (olive oil, avocado oil, nuts). Avoid fats that are SOLID at room temperature (animal fats, processed meats). Healthy fats are often found in whole grains, beans, nuts, seeds, and berries.  Why: Elevated cholesterol levels lead to build up of cholesterol on the inside of your blood vessels. This will eventually cause the blood vessels to become hard and can lead to high blood pressure and damage to your organs. When the blood flow is reduced, but the pressure is high from cholesterol buildup, parts of the cholesterol can break off and form clots that can go to the brain or heart leading to a stroke or heart attack.  Fiber: Increase amount of SOLUBLE the fiber in your diet. This helps to fill you up, lowers cholesterol, and helps with digestion. Some foods high in soluble fiber are oats, peas, beans, apples, carrots, barley, and citrus fruits.   Why: Fiber fills you up, helps remove excess cholesterol, and aids in healthy digestion which are all very important in weight management.   I recommend the following as a minimum activity routine: Purposeful walk or other physical activity at least 20 minutes every single day. This  means purposefully taking a walk, jog, bike, swim, treadmill, elliptical, dance, etc.  This activity should be ABOVE your normal daily activities, such as walking at work. Goal exercise should be at least 150 minutes a week- work your way up to this.   Heart Rate: Your maximum exercise heart rate should be 220 - Your Age in Years. When exercising, get your heart rate up, but avoid going over the maximum targeted heart rate.  60-70% of your maximum heart rate is where you tend to burn the most fat. To find this number:  220 - Age In Years= Max HR  Max  HR x 0.6 (or 0.7) = Fat Burning HR The Fat Burning HR is your goal heart rate while working out to burn the most fat.  NEVER exercise to the point your feel lightheaded, weak, nauseated, dizzy. If you experience ANY of these symptoms- STOP exercise! Allow yourself to cool down and your heart rate to come down. Then restart slower next time.  If at ANY TIME you feel chest pain or chest pressure during exercise, STOP IMMEDIATELY and seek medical attention.      Degenerative Disk Disease  Degenerative disk disease is a condition caused by changes that occur in the spinal disks as a person ages. Spinal disks are soft and compressible disks located between the bones of your spine (vertebrae). These disks act like shock absorbers. Degenerative disk disease can affect the whole spine. However, the neck and lower back are most often affected. Many changes can occur in the spinal disks with aging, such as: The spinal disks may dry and shrink. Small tears may occur in the tough, outer covering of the disk (annulus). The disk space may become smaller due to loss of water. Abnormal growths in the bone (spurs) may occur. This can put pressure on the nerve roots exiting the spinal canal, causing pain. The spinal canal may become narrowed. What are the causes? This condition may be caused by: Normal degeneration with age. Injuries. Certain activities and sports that cause damage. What increases the risk? The following factors may make you more likely to develop this condition: Being overweight. Having a family history of degenerative disk disease. Smoking and use of products that contain nicotine and tobacco. Sudden injury. Doing work that requires heavy lifting. What are the signs or symptoms? Symptoms of this condition include: Pain that varies in intensity. Some people have no pain, while others have severe pain. The location of the pain depends on the part of your backbone that is affected. You  may have: Pain in your neck or arm if a disk in your neck area is affected. Pain in your back, buttocks, or legs if a disk in your lower back is affected. Pain that becomes worse while bending or reaching up, or with twisting movements. Pain that may start gradually and worsen as time passes. It may also start after a major or minor injury. Numbness or tingling in the arms or legs. How is this diagnosed? This condition may be diagnosed based on: Your symptoms and medical history. A physical exam. Imaging tests, including: X-ray of the spine. CT scan. MRI. How is this treated? This condition may be treated with: Medicines. Injection of steroids into the back. Rehabilitation exercises. These activities aim to strengthen muscles in your back and abdomen to better support your spine. If treatments do not help to relieve your symptoms or you have severe pain, you may need surgery. Follow these instructions at home: Medicines  Take over-the-counter and prescription medicines only as told by your health care provider. Ask your health care provider if the medicine prescribed to you: Requires you to avoid driving or using machinery. Can cause constipation. You may need to take these actions to prevent or treat constipation: Drink enough fluid to keep your urine pale yellow. Take over-the-counter or prescription medicines. Eat foods that are high in fiber, such as beans, whole grains, and fresh fruits and vegetables. Limit foods that are high in fat and processed sugars, such as fried or sweet foods. Activity Rest as told by your health care provider. Avoid sitting for a long time without moving. Get up to take short walks every 1-2 hours. This is important to improve blood flow and breathing. Ask for help if you feel weak or unsteady. Return to your normal activities as told by your health care provider. Ask your health care provider what activities are safe for you. Perform relaxation  exercises as told by your health care provider. Maintain good posture. Do not lift anything that is heavier than 10 lb (4.5 kg), or the limit that you are told, until your health care provider says that it is safe. Follow proper lifting and walking techniques as told by your health care provider. Managing pain, stiffness, and swelling     If directed, put ice on the painful area. Icing can help to relieve pain. To do this: Put ice in a plastic bag. Place a towel between your skin and the bag. Leave the ice on for 20 minutes, 2-3 times a day. Remove the ice if your skin turns bright red. This is very important. If you cannot feel pain, heat, or cold, you have a greater risk of damage to the area. If directed, apply heat to the painful area as often as told by your health care provider. Heat can reduce the stiffness of your muscles. Use the heat source that your health care provider recommends, such as a moist heat pack or a heating pad. Place a towel between your skin and the heat source. Leave the heat on for 20-30 minutes. Remove the heat if your skin turns bright red. This is especially important if you are unable to feel pain, heat, or cold. You may have a greater risk of getting burned. General instructions Change your sitting, standing, and sleeping habits as told by your health care provider. Avoid sitting in the same position for long periods of time. Change positions frequently. Lose weight or maintain a healthy weight as told by your health care provider. Do not use any products that contain nicotine or tobacco, such as cigarettes, e-cigarettes, and chewing tobacco. If you need help quitting, ask your health care provider. Wear supportive footwear. Keep all follow-up visits. This is important. This may include visits for physical therapy. Contact a health care provider if you: Have pain that does not go away within 1-4 weeks. Lose your appetite. Lose weight without trying. Get  help right away if you: Have severe pain. Notice weakness in your arms, hands, or legs. Begin to lose control of your bladder or bowel movements. Have fevers or night sweats. Summary Degenerative disk disease is a condition caused by changes that occur in the spinal disks as a person ages. This condition can affect the whole spine. However, the neck and lower back are most often affected. Take over-the-counter and prescription medicines only as told by your health care provider. This information is not intended to replace advice given to you  by your health care provider. Make sure you discuss any questions you have with your health care provider. Document Revised: 01/30/2020 Document Reviewed: 01/30/2020 Elsevier Patient Education  2024 [employer].

## 2024-01-18 LAB — COMPREHENSIVE METABOLIC PANEL
ALT: 15 IU/L (ref 0–32)
AST: 20 IU/L (ref 0–40)
Albumin: 4.7 g/dL (ref 3.8–4.9)
Alkaline Phosphatase: 69 IU/L (ref 44–121)
BUN/Creatinine Ratio: 18 (ref 9–23)
BUN: 17 mg/dL (ref 6–24)
Bilirubin Total: 0.3 mg/dL (ref 0.0–1.2)
CO2: 23 mmol/L (ref 20–29)
Calcium: 9.7 mg/dL (ref 8.7–10.2)
Chloride: 105 mmol/L (ref 96–106)
Creatinine, Ser: 0.95 mg/dL (ref 0.57–1.00)
Globulin, Total: 2.3 g/dL (ref 1.5–4.5)
Glucose: 100 mg/dL — ABNORMAL HIGH (ref 70–99)
Potassium: 4.3 mmol/L (ref 3.5–5.2)
Sodium: 143 mmol/L (ref 134–144)
Total Protein: 7 g/dL (ref 6.0–8.5)
eGFR: 70 mL/min/{1.73_m2} (ref >59)

## 2024-01-18 LAB — CBC WITH DIFFERENTIAL/PLATELET
Basophils Absolute: 0 10*3/uL (ref 0.0–0.2)
Basos: 1 %
EOS (ABSOLUTE): 0 10*3/uL (ref 0.0–0.4)
Eos: 1 %
Hematocrit: 38.3 % (ref 34.0–46.6)
Hemoglobin: 12.7 g/dL (ref 11.1–15.9)
Immature Grans (Abs): 0 10*3/uL (ref 0.0–0.1)
Immature Granulocytes: 0 %
Lymphocytes Absolute: 1 10*3/uL (ref 0.7–3.1)
Lymphs: 32 %
MCH: 31.5 pg (ref 26.6–33.0)
MCHC: 33.2 g/dL (ref 31.5–35.7)
MCV: 95 fL (ref 79–97)
Monocytes Absolute: 0.3 10*3/uL (ref 0.1–0.9)
Monocytes: 8 %
Neutrophils Absolute: 1.8 10*3/uL (ref 1.4–7.0)
Neutrophils: 58 %
Platelets: 214 10*3/uL (ref 150–450)
RBC: 4.03 x10E6/uL (ref 3.77–5.28)
RDW: 12.5 % (ref 11.7–15.4)
WBC: 3.1 10*3/uL — ABNORMAL LOW (ref 3.4–10.8)

## 2024-01-18 LAB — LIPID PANEL
Chol/HDL Ratio: 2 ratio (ref 0.0–4.4)
Cholesterol, Total: 176 mg/dL (ref 100–199)
HDL: 87 mg/dL (ref >39)
LDL Chol Calc (NIH): 82 mg/dL (ref 0–99)
Triglycerides: 32 mg/dL (ref 0–149)
VLDL Cholesterol Cal: 7 mg/dL (ref 5–40)

## 2024-01-18 LAB — HEMOGLOBIN A1C
Est. average glucose Bld gHb Est-mCnc: 117 mg/dL
Hgb A1c MFr Bld: 5.7 % — ABNORMAL HIGH (ref 4.8–5.6)

## 2024-01-18 LAB — VITAMIN B12: Vitamin B-12: 319 pg/mL (ref 232–1245)

## 2024-01-18 LAB — T4, FREE: Free T4: 1.28 ng/dL (ref 0.82–1.77)

## 2024-01-18 LAB — TSH: TSH: 0.729 u[IU]/mL (ref 0.450–4.500)

## 2024-01-25 ENCOUNTER — Encounter: Payer: Self-pay | Admitting: Nurse Practitioner

## 2024-03-11 DIAGNOSIS — H524 Presbyopia: Secondary | ICD-10-CM | POA: Diagnosis not present

## 2024-03-11 DIAGNOSIS — H5213 Myopia, bilateral: Secondary | ICD-10-CM | POA: Diagnosis not present

## 2024-03-11 DIAGNOSIS — H52223 Regular astigmatism, bilateral: Secondary | ICD-10-CM | POA: Diagnosis not present

## 2024-04-25 ENCOUNTER — Other Ambulatory Visit (HOSPITAL_COMMUNITY): Payer: Self-pay

## 2024-04-25 DIAGNOSIS — S838X1A Sprain of other specified parts of right knee, initial encounter: Secondary | ICD-10-CM | POA: Diagnosis not present

## 2024-04-25 MED ORDER — CELECOXIB 200 MG PO CAPS
200.0000 mg | ORAL_CAPSULE | Freq: Two times a day (BID) | ORAL | 2 refills | Status: AC
  Filled 2024-04-25: qty 60, 30d supply, fill #0
  Filled 2024-06-05: qty 60, 30d supply, fill #1
  Filled 2024-08-07: qty 60, 30d supply, fill #2

## 2024-05-13 ENCOUNTER — Telehealth (INDEPENDENT_AMBULATORY_CARE_PROVIDER_SITE_OTHER): Payer: Self-pay | Admitting: Otolaryngology

## 2024-05-13 NOTE — Telephone Encounter (Signed)
 LVM for patient to call back.  AUDIO will not be available on 07/25.  Karis will still see on 07/25 - or - patient can reschedule all appointments.

## 2024-05-24 ENCOUNTER — Ambulatory Visit (INDEPENDENT_AMBULATORY_CARE_PROVIDER_SITE_OTHER): Payer: 59 | Admitting: Otolaryngology

## 2024-05-24 ENCOUNTER — Ambulatory Visit (INDEPENDENT_AMBULATORY_CARE_PROVIDER_SITE_OTHER): Payer: 59 | Admitting: Audiology

## 2024-05-29 ENCOUNTER — Ambulatory Visit (INDEPENDENT_AMBULATORY_CARE_PROVIDER_SITE_OTHER): Admitting: Neurology

## 2024-05-29 ENCOUNTER — Encounter: Payer: Self-pay | Admitting: Neurology

## 2024-05-29 VITALS — BP 137/79 | HR 47 | Ht 69.0 in | Wt 178.0 lb

## 2024-05-29 DIAGNOSIS — R413 Other amnesia: Secondary | ICD-10-CM | POA: Diagnosis not present

## 2024-05-29 DIAGNOSIS — E519 Thiamine deficiency, unspecified: Secondary | ICD-10-CM | POA: Diagnosis not present

## 2024-05-29 DIAGNOSIS — Z711 Person with feared health complaint in whom no diagnosis is made: Secondary | ICD-10-CM | POA: Diagnosis not present

## 2024-05-29 DIAGNOSIS — R0681 Apnea, not elsewhere classified: Secondary | ICD-10-CM | POA: Diagnosis not present

## 2024-05-29 DIAGNOSIS — R410 Disorientation, unspecified: Secondary | ICD-10-CM | POA: Diagnosis not present

## 2024-05-29 DIAGNOSIS — R0683 Snoring: Secondary | ICD-10-CM | POA: Diagnosis not present

## 2024-05-29 DIAGNOSIS — E538 Deficiency of other specified B group vitamins: Secondary | ICD-10-CM | POA: Diagnosis not present

## 2024-05-29 DIAGNOSIS — R5383 Other fatigue: Secondary | ICD-10-CM | POA: Diagnosis not present

## 2024-05-29 DIAGNOSIS — Z818 Family history of other mental and behavioral disorders: Secondary | ICD-10-CM

## 2024-05-29 NOTE — Patient Instructions (Addendum)
 Mri of the brain w/wo contrast Blood work Consult my sleep doctor to see if sleep evaluation Formal neurocognitive testing The XX brain The MIND diet - [employer]  Management of Memory Problems  There are some general things you can do to help manage your memory problems.  Your memory may not in fact recover, but by using techniques and strategies you will be able to manage your memory difficulties better.  1)  Establish a routine. Try to establish and then stick to a regular routine.  By doing this, you will get used to what to expect and you will reduce the need to rely on your memory.  Also, try to do things at the same time of day, such as taking your medication or checking your calendar first thing in the morning. Think about think that you can do as a part of a regular routine and make a list.  Then enter them into a daily planner to remind you.  This will help you establish a routine.  2)  Organize your environment. Organize your environment so that it is uncluttered.  Decrease visual stimulation.  Place everyday items such as keys or cell phone in the same place every day (ie.  Basket next to front door) Use post it notes with a brief message to yourself (ie. Turn off light, lock the door) Use labels to indicate where things go (ie. Which cupboards are for food, dishes, etc.) Keep a notepad and pen by the telephone to take messages  3)  Memory Aids A diary or journal/notebook/daily planner Making a list (shopping list, chore list, to do list that needs to be done) Using an alarm as a reminder (kitchen timer or cell phone alarm) Using cell phone to store information (Notes, Calendar, Reminders) Calendar/White board placed in a prominent position Post-it notes  In order for memory aids to be useful, you need to have good habits.  It's no good remembering to make a note in your journal if you don't remember to look in it.  Try setting aside a certain time of day to look in  journal.  4)  Improving mood and managing fatigue. There may be other factors that contribute to memory difficulties.  Factors, such as anxiety, depression and tiredness can affect memory. Regular gentle exercise can help improve your mood and give you more energy. Simple relaxation techniques may help relieve symptoms of anxiety Try to get back to completing activities or hobbies you enjoyed doing in the past. Learn to pace yourself through activities to decrease fatigue. Find out about some local support groups where you can share experiences with others. Try and achieve 7-8 hours of sleep at night.

## 2024-05-29 NOTE — Progress Notes (Unsigned)
 [facility]    Provider:  Dr Ines Requesting Provider: Early, Camie BRAVO, NP Primary Care Provider:  Oris Camie BRAVO, NP  CC:  worried about cognition with a fhx of dementia  HPI:  [name] is a 58 y.o. female here as requested by Early, Camie BRAVO, NP for memory changes. has Acute pain of left hip; Prediabetes; Conductive hearing loss in right ear; Impacted cerumen of both ears; Encounter for medical examination to establish care; Memory changes; Gastroesophageal reflux disease; Degeneration of intervertebral disc of lumbar region with discogenic back pain; and Menopausal symptoms on their problem list.  58 year old patient referred for memory changes.  I reviewed [name] notes from January 17, 2024 where she established care.  She has a history of hearing loss in the right ear 50% or less attributed to ruptured eardrum from a Q-tip.  She experiences occasional splotchy gray spots in her vision, particularly when looking at a computer paper, she has been evaluated by Dr. Kennyth who identified a fluid pocket under the retina no major vision changes.  She has a history of degenerative disc disease back pain.  She has a family history of dementia and Alzheimer's disease with her mother exhibiting memory concerns and previously her grandmother affected.  She experiences some forgetfulness is concerned about her memory especially given her family history.  She asked for an evaluation for this.   REALLY lovely patient. She has a FHx of dementia. More short-term memory loss, such as goong to store and trying to remember what she went for. She is at work and she is in a room with a patient and midd way down the hall she forgets and has to go back and check. She started noticing 2 years ago, shortly after we saw her with her mother here. Her mother continues to progress. Mother started having memory loss around 82(late onset). She used to be able to multi-task but now she is having more  difficulty concentrating, completing tasks, she forgets what the exact day it is and sometimes her patient's correct her with orientation questions. No problems with birthdays, anniversaries, occ she will get a little confused when driving like she should have taken that turn, paying bills without any problems. Daughter is noticing more. Daughter feels her memory is gone, daughter tells her things and patient doesn't remember the next day. Grandmother had memory problems but older in age as well. She has one episode of hitting her heas and seeing stars but no loss of consciousness ot known concussion. Otherwise no sports concussions or otherwise. In her teens she had depression but ntohgin since then, mood is fine. No significant hx of medical conditions. She started peri-menopause at [age] since about then no periods. Doesn't think it is adhd. Daughter states mainly forgetting conversations, she does not repeat. If she is home and resting she will take a cat nap and she snore, has woken herself up snoring, daughter states she think sthe neirghbors can hear her snoring, wakes frequently at night, sometimes dry mouth in the mornings and rare headaches. Feels fatigued during the day not a lot of energy. Frequently.   Reviewed notes, labs and imaging from outside physicians, which showed:  Bloodwork 01/17/2024: reviewed Tsh nml, ft4 nml, cmp and cbc unremarkable essentially nml, b12 319 low-nml, hgba1c 5.7 pre-diabetes  Review of Systems: Patient complains of symptoms per HPI as well as the following symptoms None. Pertinent negatives and positives per HPI. All others negative.   Social History  Socioeconomic History   Marital status: Married    Spouse name: Not on file   Number of children: Not on file   Years of education: Not on file   Highest education level: Not on file  Occupational History   Not on file  Tobacco Use   Smoking status: Never    Passive exposure: Never   Smokeless tobacco:  Never  Vaping Use   Vaping status: Never Used  Substance and Sexual Activity   Alcohol use: Yes    Alcohol/week: 1.0 standard drink of alcohol    Types: 1 Glasses of wine per week   Drug use: No   Sexual activity: Not on file  Other Topics Concern   Not on file  Social History Narrative   Pt lives with family    Pt works    Social Drivers of Corporate investment banker Strain: Not on file  Food Insecurity: Not on file  Transportation Needs: Not on file  Physical Activity: Not on file  Stress: Not on file  Social Connections: Not on file  Intimate Partner Violence: Not on file    Family History  Problem Relation Age of Onset   Breast cancer Mother    Alzheimer's disease Maternal Grandmother     Past Medical History:  Diagnosis Date   Arthritis    Family history of adverse reaction to anesthesia    mom has ponv   GERD (gastroesophageal reflux disease)     Patient Active Problem List   Diagnosis Date Noted   Encounter for medical examination to establish care 01/17/2024   Memory changes 01/17/2024   Gastroesophageal reflux disease 01/17/2024   Degeneration of intervertebral disc of lumbar region with discogenic back pain 01/17/2024   Menopausal symptoms 01/17/2024   Conductive hearing loss in right ear 11/24/2023   Impacted cerumen of both ears 11/24/2023   Prediabetes 05/20/2021   Acute pain of left hip 09/07/2016    Past Surgical History:  Procedure Laterality Date   ESOPHAGOGASTRODUODENOSCOPY (EGD) WITH PROPOFOL  N/A 07/02/2021   Procedure: ESOPHAGOGASTRODUODENOSCOPY (EGD) WITH PROPOFOL ;  Surgeon: Rollin Dover, MD;  Location: [facility];  Service: Endoscopy;  Laterality: N/A;   LIPOMA EXCISION Left 08/17/2016   Procedure: EXCISION OF SUBCUTANEOUS LIPOMA LEFT HIP;  Surgeon: Donnice Lima, MD;  Location: [facility];  Service: General;  Laterality: Left;   UPPER ESOPHAGEAL ENDOSCOPIC ULTRASOUND (EUS) N/A 07/02/2021   Procedure: UPPER ESOPHAGEAL  ENDOSCOPIC ULTRASOUND (EUS);  Surgeon: Rollin Dover, MD;  Location: [facility];  Service: Endoscopy;  Laterality: N/A;    Current Outpatient Medications  Medication Sig Dispense Refill   acetaminophen  (TYLENOL ) 500 MG tablet Take 1,000 mg by mouth every 6 (six) hours as needed for moderate pain.     Apoaequorin (PREVAGEN PO) Take 1 tablet by mouth daily.     calcium carbonate (TUMS - DOSED IN MG ELEMENTAL CALCIUM) 500 MG chewable tablet Chew 4 tablets by mouth daily.     celecoxib  (CELEBREX ) 200 MG capsule Take 1 capsule (200 mg total) by mouth 2 (two) times daily. 60 capsule 2   cholecalciferol (VITAMIN D) 25 MCG (1000 UNIT) tablet Take 1,000 Units by mouth in the morning and at bedtime.     diclofenac Sodium (VOLTAREN) 1 % GEL Apply 1 application topically daily as needed (pain). (Patient taking differently: Apply 1 application  topically as needed (pain).)     docusate sodium (COLACE) 100 MG capsule Take 100 mg by mouth 2 (two) times daily.  famotidine  (PEPCID ) 40 MG tablet Take 1 tablet (40 mg total) by mouth 2 (two) times daily. 180 tablet 4   ibuprofen  (ADVIL ) 200 MG tablet Take 400-600 mg by mouth every 6 (six) hours as needed for moderate pain.     pantoprazole  (PROTONIX ) 40 MG tablet Take 1 tablet (40 mg total) by mouth daily 20 minutes before breakfast. 90 tablet 4   Turmeric (QC TUMERIC COMPLEX) 500 MG CAPS Take 500 mg by mouth daily. (Patient taking differently: Take 500 mg by mouth as needed.)     vitamin E 180 MG (400 UNITS) capsule Take 400 Units by mouth daily.     No current facility-administered medications for this visit.    Allergies as of 05/29/2024 - Review Complete 05/29/2024  Allergen Reaction Noted   Compazine [prochlorperazine edisylate] Other (See Comments) 07/14/2012   Flexeril [cyclobenzaprine] Other (See Comments) 07/14/2012   Prochlorperazine Other (See Comments) 04/14/2021   Reglan [metoclopramide] Other (See Comments) 07/14/2012    Vitals: BP  137/79   Pulse (!) 47   Ht 5' 9 (1.753 m)   Wt 178 lb (80.7 kg)   BMI 26.29 kg/m  Last Weight:  Wt Readings from Last 1 Encounters:  05/29/24 178 lb (80.7 kg)   Last Height:   Ht Readings from Last 1 Encounters:  05/29/24 5' 9 (1.753 m)     Physical exam: Exam: Gen: NAD, conversant, well nourised, obese, well groomed                     CV: RRR, no MRG. No Carotid Bruits. No peripheral edema, warm, nontender Eyes: Conjunctivae clear without exudates or hemorrhage  Neuro: Detailed Neurologic Exam  Speech:    Speech is normal; fluent and spontaneous with normal comprehension.  Cognition:    The patient is oriented to person, place, and time;     recent and remote memory intact;     language fluent;     normal attention, concentration,     fund of knowledge Cranial Nerves:    05/29/2024   10:43 AM  Montreal Cognitive Assessment   Visuospatial/ Executive (0/5) 5  Naming (0/3) 3  Attention: Read list of digits (0/2) 1  Attention: Read list of letters (0/1) 1  Attention: Serial 7 subtraction starting at 100 (0/3) 3  Language: Repeat phrase (0/2) 2  Language : Fluency (0/1) 1  Abstraction (0/2) 2  Delayed Recall (0/5) 3  Orientation (0/6) 6  Total 27       The pupils are equal, round, and reactive to light. The fundi are normal and spontaneous venous pulsations are present. Visual fields are full to finger confrontation. Extraocular movements are intact. Trigeminal sensation is intact and the muscles of mastication are normal. The face is symmetric. The palate elevates in the midline. Hearing intact to voice(has a hearing aid in right ear). Voice is normal. Shoulder shrug is normal. The tongue has normal motion without fasciculations.   Coordination: Normal  Gait: Normal  Motor Observation:    No asymmetry, no atrophy, and no involuntary movements noted. Tone:    Normal muscle tone.    Posture:    Posture is normal. normal erect    Strength:    Strength  is V/V in the upper and lower limbs.      Sensation: intact to LT     Reflex Exam:  DTR's:    Deep tendon reflexes in the upper and lower extremities are normal bilaterally.   Toes:  The toes are downgoing bilaterally.   Clonus:    Clonus is absent.    Assessment/Plan: This is an incredibly lovely patient who is 58 years old accompanied by her daughter.  Patient is worried about her cognition, she has a family history of dementia, her daughter is here today because she is also concerned.  She reports short-term memory loss, she started noticing this 2 years ago, her mother has dementia.  Patient states she is having difficulty completing task, forgets what exact date is and symptoms are patient's after correct her with orientation questions, daughter states she tells her things a patient does not remember the next day, forgetting conversations.  Patient's Montreal cognitive assessment score was 27 out of 30, I suspect that this is normal cognitive aging.  However given her family history, patient as well as daughter's concerns, I will order an initial workup.  Mri of the brain w/wo contrast for reversible causes of memory loss Blood work today Patient states she is very fatigued during the day, she has woken herself up snoring, her daughter states she snores so loud that the neighbors proably hear her: will refer to sleep evaluation Formal neurocognitive testing: Unsure if referral for formal memory testing will be accepted given her age and normal MoCA however if we could get a baseline for patient would be helpful in the future The XX brain is a great book, recommended Recommended the MIND diet - [employer]   Orders Placed This Encounter  Procedures   MR BRAIN W WO CONTRAST   B12 and Folate Panel   Methylmalonic acid, serum   Vitamin B1   BUN   Creatinine, Serum   Ambulatory referral to Sleep Studies   No orders of the defined types were placed in this encounter.   Cc:  Early, Camie BRAVO, NP,  Early, Camie BRAVO, NP  Onetha Epp, MD  [facility] [address] [place], [place] [address]  Phone [phone] Fax [phone]

## 2024-05-30 ENCOUNTER — Encounter: Payer: Self-pay | Admitting: Neurology

## 2024-06-02 LAB — BUN: BUN: 17 mg/dL (ref 6–24)

## 2024-06-02 LAB — VITAMIN B1: Thiamine: 61.1 nmol/L — AB (ref 66.5–200.0)

## 2024-06-02 LAB — B12 AND FOLATE PANEL
Folate: 3.8 ng/mL (ref >3.0)
Vitamin B-12: 326 pg/mL (ref 232–1245)

## 2024-06-02 LAB — CREATININE, SERUM
Creatinine, Ser: 0.9 mg/dL (ref 0.57–1.00)
eGFR: 74 mL/min/1.73 (ref >59)

## 2024-06-02 LAB — METHYLMALONIC ACID, SERUM: Methylmalonic Acid: 166 nmol/L (ref 0–378)

## 2024-06-04 ENCOUNTER — Ambulatory Visit: Payer: Self-pay | Admitting: Neurology

## 2024-06-05 ENCOUNTER — Ambulatory Visit (INDEPENDENT_AMBULATORY_CARE_PROVIDER_SITE_OTHER)

## 2024-06-05 DIAGNOSIS — R410 Disorientation, unspecified: Secondary | ICD-10-CM | POA: Diagnosis not present

## 2024-06-05 DIAGNOSIS — R413 Other amnesia: Secondary | ICD-10-CM

## 2024-06-05 DIAGNOSIS — Z818 Family history of other mental and behavioral disorders: Secondary | ICD-10-CM | POA: Diagnosis not present

## 2024-06-05 MED ORDER — GADOBENATE DIMEGLUMINE 529 MG/ML IV SOLN
15.0000 mL | Freq: Once | INTRAVENOUS | Status: AC | PRN
  Administered 2024-06-05: 15 mL via INTRAVENOUS

## 2024-06-05 NOTE — Telephone Encounter (Signed)
 Spoke with patient and discussed lab results and recommendations per Dr Chalice who reviewed labs on behalf of Dr Ines. Pt advised that due to thiamine deficiency she should stop drinking alcohol. Pt states she socially drinks wine, not even 1 daily but she verbalized understanding. Patient also aware to start a B complex OTC vitamin. B12 low normal range. Her questions were answered regarding thiamine deficiency. She appreciates labs being sent to PCP. Will also inform Dr Ines.

## 2024-06-17 ENCOUNTER — Other Ambulatory Visit (HOSPITAL_COMMUNITY): Payer: Self-pay

## 2024-07-04 ENCOUNTER — Other Ambulatory Visit (HOSPITAL_COMMUNITY): Payer: Self-pay

## 2024-07-08 ENCOUNTER — Other Ambulatory Visit (HOSPITAL_COMMUNITY): Payer: Self-pay

## 2024-07-08 ENCOUNTER — Encounter (HOSPITAL_COMMUNITY): Payer: Self-pay

## 2024-07-10 ENCOUNTER — Institutional Professional Consult (permissible substitution): Admitting: Neurology

## 2024-07-17 DIAGNOSIS — Z780 Asymptomatic menopausal state: Secondary | ICD-10-CM | POA: Diagnosis not present

## 2024-07-17 DIAGNOSIS — Z01419 Encounter for gynecological examination (general) (routine) without abnormal findings: Secondary | ICD-10-CM | POA: Diagnosis not present

## 2024-07-24 ENCOUNTER — Encounter: Payer: Self-pay | Admitting: Neurology

## 2024-07-24 ENCOUNTER — Ambulatory Visit (INDEPENDENT_AMBULATORY_CARE_PROVIDER_SITE_OTHER): Admitting: Neurology

## 2024-07-24 ENCOUNTER — Other Ambulatory Visit: Payer: Self-pay | Admitting: Neurology

## 2024-07-24 VITALS — BP 131/81 | HR 89 | Ht 69.0 in | Wt 179.0 lb

## 2024-07-24 DIAGNOSIS — G478 Other sleep disorders: Secondary | ICD-10-CM

## 2024-07-24 DIAGNOSIS — R4189 Other symptoms and signs involving cognitive functions and awareness: Secondary | ICD-10-CM

## 2024-07-24 DIAGNOSIS — G4709 Other insomnia: Secondary | ICD-10-CM

## 2024-07-24 DIAGNOSIS — R413 Other amnesia: Secondary | ICD-10-CM

## 2024-07-24 DIAGNOSIS — H04123 Dry eye syndrome of bilateral lacrimal glands: Secondary | ICD-10-CM | POA: Diagnosis not present

## 2024-07-24 DIAGNOSIS — H35373 Puckering of macula, bilateral: Secondary | ICD-10-CM | POA: Diagnosis not present

## 2024-07-24 NOTE — Progress Notes (Signed)
 @[facility]   Provider:  Dedra Gores, MD    Primary Care Physician:  Oris Camie BRAVO, NP [address] [place] [address]   Referring Provider: Ines Onetha NOVAK, Md [address] [place],  [place] [address]        Chief Concern for this Consultation:   Patient presents with     , reports she gets about 6 hrs of sleep a night. She has more trouble with staying asleep. She does snore and has woken herself up. Increase in daytime fatigue. No known family history of sleep disorders.      HPI: I have the pleasure of meeting with [name] , on 07-24-2024, who is a 58 y.o.  female patient known to me from the  neuro hospital floor and cardiac ICU,  seen upon a referral by Dr Ines. MD for a Sleep Medicine Consultation.   The patient's referral information asked for an evaluation of fatigue; in a former  shift worker, now strictly on days.   Chief concern according to patient: I am light sleeper, inability to stay asleep, snoring- husband and daughter have witnessed snoring , loud enough to be heart in another room.    [name]   has a past medical history of Arthritis, Family history of adverse reaction to anesthesia, and GERD (gastroesophageal reflux disease)..Dr. Kristie is her GI- MD, on Protonix  and famotidine .  Sleep relevant medical/ surgical and symptom history: The patient reports onset of  Insomnia symptoms over a time period of years, slow progression, mind racing, unable to relax.  No history of  Autoimmune disorders,  Pre- Anemia, Teenage -Depression.  The patient had no previous sleep evaluations.  Family medical history: There are  aunts and uncles ( biological family members)  affected by Sleep apnea , or Insomnia (mother ) , by excessive daytime sleepiness (mother takes naps).     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  One dog- doesn't  sleep in the bedroom.  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 The patient currently used to work in shifts (night/ rotating,) until 2016. The workplace involves physical activity.   Nicotine use: /.  ETOH use: social ,  Caffeine intake in form of:  Coffee ( 1 a day ), Soft drinks (/), Tea (  about 2 a day) - no  Energy drinks ( including those containing  taurine ). Caffeine is last consumed in PM.  Exercises regularly in form of walking daily 1.5 miles. .  Volunteering, ( member of a club, church or other community memberships and engagements).      Sleep habits and routines are as follows: The patient's dinner time is around 7-8 PM.  Evening time is spent by watching TV, doing crossword puzzles. The patient goes to bed at, or close to, 11 PM. The bedroom is shared with spouse  and is described as cool, quiet, and dark.  The patient reports that it takes 15-30 minutes to fall asleep, then continues to sleep for interval of 2 hours , uninterrupted or woken up by the need to void (Nocturia),  0-1 times..   The preferred sleep position is laterally , with support of 1 pillow, (non- adjustable bed/ recliner ).  The total estimated sleep time is circa 5-6 hours.  Dreams are reportedly frequent/ and  can be vivid. Dream enactment has not been reported.   5.45 AM is the usual week- day rise time. The patient wakes up spontaneously at 5.30  before  alarm . She  reports not fully feeling refreshed and restored in the morning, waking with symptoms such as dry mouth,  rare morning headaches, no cluster HA, stiffness or pain in knees and wrists , and fatigue.   No sleep paralysis has been experienced.  Naps in daytime are taken infrequently (there is a rare desire to nap and opportunity), lasting from 45 to 60 minutes  and have not much of a  refreshing quality.  These do not interfere with nocturnal sleep.    [name] is a 58 y.o. female here as requested by Early,  Sara E, NP for memory changes. has Acute pain of left hip; Prediabetes; Conductive hearing loss in right ear; Impacted cerumen of both ears; Encounter for medical examination to establish care; Memory changes; Gastroesophageal reflux disease; Degeneration of intervertebral disc of lumbar region with discogenic back pain; and Menopausal symptoms on their problem list.   58 year old patient referred for memory changes.  I reviewed [name] notes from January 17, 2024 where she established care.  She has a history of hearing loss in the right ear 50% or less attributed to ruptured eardrum from a Q-tip.  She experiences occasional splotchy gray spots in her vision, particularly when looking at a computer paper, she has been evaluated by Dr. Kennyth who identified a fluid pocket under the retina no major vision changes.  She has a history of degenerative disc disease back pain.  She has a family history of dementia and Alzheimer's disease with her mother exhibiting memory concerns and previously her grandmother affected.  She experiences some forgetfulness is concerned about her memory especially given her family history.  She asked for an evaluation for this.    REALLY lovely patient. She has a FHx of dementia. More short-term memory loss, such as goong to store and trying to remember what she went for. She is at work and she is in a room with a patient and midd way down the hall she forgets and has to go back and check. She started noticing 2 years ago, shortly after we saw her with her mother here. Her mother continues to progress. Mother started having memory loss around 82(late onset). She used to be able to multi-task but now she is having more difficulty concentrating, completing tasks, she forgets what the exact day it is and sometimes her patient's correct her with orientation questions. No problems with birthdays, anniversaries, occ she will get a little confused when driving like she should have taken  that turn, paying bills without any problems. Daughter is noticing more. Daughter feels her memory is gone, daughter tells her things and patient doesn't remember the next day. Grandmother had memory problems but older in age as well. She has one episode of hitting her heas and seeing stars but no loss of consciousness ot known concussion. Otherwise no sports concussions or otherwise. In her teens she had depression but ntohgin since then, mood is fine. No significant hx of medical conditions. She started peri-menopause at [age] since about then no periods. Doesn't think it is adhd. Daughter states mainly forgetting conversations, she does not repeat. If she is home and resting she will take a cat nap and she snore, has woken herself up snoring, daughter states she think [place] can hear her snoring, wakes frequently  at night, sometimes dry mouth in the mornings and rare headaches. Feels fatigued during the day not a lot of energy. Frequently.      Review of Systems: Out of a complete 14 system review, the patient complains of only the following symptoms, and all other reviewed systems are negative.:   Insomnia - shorter sleep, interrupted, Snoring, Nocturia   How likely are you to doze in the following situations: 0 = not likely, 1 = slight chance, 2 = moderate chance, 3 = high chance Sitting and Reading? Watching Television? Sitting inactive in a public place (theater or meeting)? As a passenger in a car for an hour without a break? Lying down in the afternoon when circumstances permit? Sitting and talking to someone? Sitting quietly after lunch without alcohol? In a car, while stopped for a few minutes in traffic?   Total ESS =3 / 24 points.    FSS endorsed at 31/ 63 points.  GDS:  Social History   Socioeconomic History   Marital status: Married    Spouse name: Not on file   Number of children: Not on file   Years of education: Not on file   Highest education level: Not on  file  Occupational History   Not on file  Tobacco Use   Smoking status: Never    Passive exposure: Never   Smokeless tobacco: Never  Vaping Use   Vaping status: Never Used  Substance and Sexual Activity   Alcohol use: Yes    Alcohol/week: 1.0 standard drink of alcohol    Types: 1 Glasses of wine per week   Drug use: No   Sexual activity: Not on file  Other Topics Concern   Not on file  Social History Narrative   Pt lives with family    Pt works    Social Drivers of Corporate investment banker Strain: Not on file  Food Insecurity: Not on file  Transportation Needs: Not on file  Physical Activity: Not on file  Stress: Not on file  Social Connections: Not on file    Family History  Problem Relation Age of Onset   Breast cancer Mother    Alzheimer's disease Maternal Grandmother     Past Medical History:  Diagnosis Date   Arthritis    Family history of adverse reaction to anesthesia    mom has ponv   GERD (gastroesophageal reflux disease)     Past Surgical History:  Procedure Laterality Date   ESOPHAGOGASTRODUODENOSCOPY (EGD) WITH PROPOFOL  N/A 07/02/2021   Procedure: ESOPHAGOGASTRODUODENOSCOPY (EGD) WITH PROPOFOL ;  Surgeon: Rollin Dover, MD;  Location: [facility];  Service: Endoscopy;  Laterality: N/A;   LIPOMA EXCISION Left 08/17/2016   Procedure: EXCISION OF SUBCUTANEOUS LIPOMA LEFT HIP;  Surgeon: Donnice Lima, MD;  Location: [facility];  Service: General;  Laterality: Left;   UPPER ESOPHAGEAL ENDOSCOPIC ULTRASOUND (EUS) N/A 07/02/2021   Procedure: UPPER ESOPHAGEAL ENDOSCOPIC ULTRASOUND (EUS);  Surgeon: Rollin Dover, MD;  Location: [facility];  Service: Endoscopy;  Laterality: N/A;     Current Outpatient Medications on File Prior to Visit  Medication Sig Dispense Refill   acetaminophen  (TYLENOL ) 500 MG tablet Take 1,000 mg by mouth every 6 (six) hours as needed for moderate pain.     Apoaequorin (PREVAGEN PO) Take 1 tablet by mouth daily.      calcium carbonate (TUMS - DOSED IN MG ELEMENTAL CALCIUM) 500 MG chewable tablet Chew 4 tablets by mouth daily.     celecoxib  (CELEBREX ) 200 MG  capsule Take 1 capsule (200 mg total) by mouth 2 (two) times daily. 60 capsule 2   cholecalciferol (VITAMIN D) 25 MCG (1000 UNIT) tablet Take 1,000 Units by mouth in the morning and at bedtime.     diclofenac Sodium (VOLTAREN) 1 % GEL Apply 1 application topically daily as needed (pain). (Patient taking differently: Apply 1 application  topically as needed (pain).)     docusate sodium (COLACE) 100 MG capsule Take 100 mg by mouth 2 (two) times daily.     famotidine  (PEPCID ) 40 MG tablet Take 1 tablet (40 mg total) by mouth 2 (two) times daily. 180 tablet 4   ibuprofen  (ADVIL ) 200 MG tablet Take 400-600 mg by mouth every 6 (six) hours as needed for moderate pain.     pantoprazole  (PROTONIX ) 40 MG tablet Take 1 tablet (40 mg total) by mouth daily 20 minutes before breakfast. 90 tablet 4   Turmeric (QC TUMERIC COMPLEX) 500 MG CAPS Take 500 mg by mouth daily. (Patient taking differently: Take 500 mg by mouth as needed.)     vitamin E 180 MG (400 UNITS) capsule Take 400 Units by mouth daily.     No current facility-administered medications on file prior to visit.    Allergies  Allergen Reactions   Compazine [Prochlorperazine Edisylate] Other (See Comments)    angioedema   Flexeril [Cyclobenzaprine] Other (See Comments)    Severe confusion   Prochlorperazine Other (See Comments)   Reglan [Metoclopramide] Other (See Comments)    Angioedema     Vitals:   07/24/24 0904  BP: 131/81  Pulse: 89      Physical exam:   General: The patient was alert and appears not in acute distress.  Mood and affect are appropriate .  The patient's interactions are: Cooperative, makes eye contact, follows the instructions and answers questions coherently.  The patient is groomed and appropriately groomed and dressed. Head: Normocephalic, atraumatic.  Neck is  supple. Mallampati: 3 plus .  The neck circumference measured 15 inches. Nasal airflow was patent ,   Dental status:  biological Cardiovascular:  Regular rate and cardiac rhythm by palpable pulse. Respiratory: no audible wheezing, no tachypnoea.   Skin:  Without evidence of ankle edema. No discoloration.  Trunk:  BMI is 26.4  The patient's posture was erect.   Neurologic exam : The patient was awake and alert, oriented to place and time.   Attention span & concentration ability appeared normal.  Speech was fluent, without dysarthria, dysphonia or aphasia, and of normal volume.     Cranial nerves:  There was no loss of smell or taste reported  Pupils are round, equal in size and briskly reactive to light.  Funduscopic exam was deferred.  Extraocular movements in vertical and horizontal planes were intact and without nystagmus. (No Diplopia reported). Visual fields by finger perimetry are intact. Hearing was intact to soft voice.  She wears a hearing aid for 2 years - right ear only.   Facial sensation intact to fine touch.  Facial motor strength: Symmetric movement and tongue and uvula move midline.  Neck ROM: rotation, tilt and flexion extension were intact for age and shoulder shrug was symmetrical.    Motor exam:  Symmetric bulk, strength and ROM.   Normal tone without cog- wheeling, and symmetric grip strength.   Sensory:  Fine touch and vibration were tested by tuning fork and intact.  Proprioception tested in the upper extremities was normal.   Coordination: The patient reported no problems with  button closure and no changes to penmanship.   The Finger-to-nose maneuver was intact without evidence of ataxia, dysmetria or tremor.   Gait and station: Patient could rise unassisted from a seated position, without bracing, and walked without assistive device.   Deep tendon reflexes: Upper extremities did show symmetric DTRs. Lower extremity DTRs were symmetric .   I would like  to thank Early, Camie BRAVO, NP and Ines Onetha NOVAK, Md [address] [place],  [place] [address] for allowing me to meet with  [name], [name]     Assessment ;   Sleep is shorter than desired, less restorative and accompanied by snoring.   Minor Risk factors for OSA were present,  including : Body mass index is 26.43 kg/m., neck size  15  and upper airway anatomy is conducive to snoring.   Reviewed outside labs and Vit B 12 levels.    My Plan is to proceed with:  HST, ordered watch pat today.    I plan to follow up personally or through our NPs within 3-5 months.   A total time of  40  minutes consistent of a part of face to face encounter , exam and interview,  and additional preparation time for chart review was spent .  At today's visit, we discussed treatment options, associated risk and benefits, and engage in counseling as needed including, but not limited to:  Sleep hygiene, Quality Sleep Habits, and Safety concerns for patients with daytime sleepiness who are warned to not operate machinery/ motor vehicles when drowsy. Risk factors for sleep apnea were identified: air way anatomy.  Caffeine reduction.   Additionally, the following were reviewed: Past medical records, past medical and surgical history, family and social background, as well as relevant laboratory results, imaging findings, and medical notes, where applicable.  This note was generated by myself in part by using dictation software, and as a result, it may contain unintentional typos and errors.  Nevertheless, effort was made to accurately convey the pertinent aspects of the patient's visit.   Dedra Gores, MD   [facility] and [facility] Sleep Board certified in Sleep Medicine by The [employer] of Sleep Medicine and Diplomate of the [employer] (AASM) . Board certified In Neurology, Diplomat of the ABPN,  Fellow of the [employer].

## 2024-07-24 NOTE — Patient Instructions (Signed)
 Insomnia Insomnia is a sleep disorder that makes it difficult to fall asleep or stay asleep. Insomnia can cause fatigue, low energy, difficulty concentrating, mood swings, and poor performance at work or school. There are three different ways to classify insomnia: Difficulty falling asleep. Difficulty staying asleep. Waking up too early in the morning. Any type of insomnia can be long-term (chronic) or short-term (acute). Both are common. Short-term insomnia usually lasts for 3 months or less. Chronic insomnia occurs at least three times a week for longer than 3 months. What are the causes? Insomnia may be caused by another condition, situation, or substance, such as: Having certain mental health conditions, such as anxiety and depression. Using caffeine, alcohol, tobacco, or drugs. Having gastrointestinal conditions, such as gastroesophageal reflux disease (GERD). Having certain medical conditions. These include: Asthma. Alzheimer's disease. Stroke. Chronic pain. An overactive thyroid  gland (hyperthyroidism). Other sleep disorders, such as restless legs syndrome and sleep apnea. Menopause. Sometimes, the cause of insomnia may not be known. What increases the risk? Risk factors for insomnia include: Gender. Females are affected more often than males. Age. Insomnia is more common as people get older. Stress and certain medical and mental health conditions. Lack of exercise. Having an irregular work schedule. This may include working night shifts and traveling between different time zones. What are the signs or symptoms? If you have insomnia, the main symptom is having trouble falling asleep or having trouble staying asleep. This may lead to other symptoms, such as: Feeling tired or having low energy. Feeling nervous about going to sleep. Not feeling rested in the morning. Having trouble concentrating. Feeling irritable, anxious, or depressed. How is this diagnosed? This condition  may be diagnosed based on: Your symptoms and medical history. Your health care provider may ask about: Your sleep habits. Any medical conditions you have. Your mental health. A physical exam. How is this treated? Treatment for insomnia depends on the cause. Treatment may focus on treating an underlying condition that is causing the insomnia. Treatment may also include: Medicines to help you sleep. Counseling or therapy. Lifestyle adjustments to help you sleep better. Follow these instructions at home: Eating and drinking  Limit or avoid alcohol, caffeinated beverages, and products that contain nicotine  and tobacco, especially close to bedtime. These can disrupt your sleep. Do not eat a large meal or eat spicy foods right before bedtime. This can lead to digestive discomfort that can make it hard for you to sleep. Sleep habits  Keep a sleep diary to help you and your health care provider figure out what could be causing your insomnia. Write down: When you sleep. When you wake up during the night. How well you sleep and how rested you feel the next day. Any side effects of medicines you are taking. What you eat and drink. Make your bedroom a dark, comfortable place where it is easy to fall asleep. Put up shades or blackout curtains to block light from outside. Use a white noise machine to block noise. Keep the temperature cool. Limit screen use before bedtime. This includes: Not watching TV. Not using your smartphone, tablet, or computer. Stick to a routine that includes going to bed and waking up at the same times every day and night. This can help you fall asleep faster. Consider making a quiet activity, such as reading, part of your nighttime routine. Try to avoid taking naps during the day so that you sleep better at night. Get out of bed if you are still awake after  15 minutes of trying to sleep. Keep the lights down, but try reading or doing a quiet activity. When you feel  sleepy, go back to bed. General instructions Take over-the-counter and prescription medicines only as told by your health care provider. Exercise regularly as told by your health care provider. However, avoid exercising in the hours right before bedtime. Use relaxation techniques to manage stress. Ask your health care provider to suggest some techniques that may work well for you. These may include: Breathing exercises. Routines to release muscle tension. Visualizing peaceful scenes. Make sure that you drive carefully. Do not drive if you feel very sleepy. Keep all follow-up visits. This is important. Contact a health care provider if: You are tired throughout the day. You have trouble in your daily routine due to sleepiness. You continue to have sleep problems, or your sleep problems get worse. Get help right away if: You have thoughts about hurting yourself or someone else. Get help right away if you feel like you may hurt yourself or others, or have thoughts about taking your own life. Go to your nearest emergency room or: Call 911. Call the National Suicide Prevention Lifeline at 571-339-0578 or 988. This is open 24 hours a day. Text the Crisis Text Line at 425-822-8111. Summary Insomnia is a sleep disorder that makes it difficult to fall asleep or stay asleep. Insomnia can be long-term (chronic) or short-term (acute). Treatment for insomnia depends on the cause. Treatment may focus on treating an underlying condition that is causing the insomnia. Keep a sleep diary to help you and your health care provider figure out what could be causing your insomnia. This information is not intended to replace advice given to you by your health care provider. Make sure you discuss any questions you have with your health care provider. Document Revised: 09/27/2021 Document Reviewed: 09/27/2021 Elsevier Patient Education  2024 [employer] Quality Sleep Information, Adult Quality sleep is important  for your mental and physical health. It also improves your quality of life. Quality sleep means you: Are asleep for most of the time you are in bed. Fall asleep within 30 minutes. Wake up no more than once a night. Are awake for no longer than 20 minutes if you do wake up during the night. Most adults need 7-8 hours of quality sleep each night. How can poor sleep affect me? If you do not get enough quality sleep, you may have: Mood swings. Daytime sleepiness. Decreased alertness, reaction time, and concentration. Sleep disorders, such as insomnia and sleep apnea. Difficulty with: Solving problems. Coping with stress. Paying attention. These issues may affect your performance and productivity at work, school, and home. Lack of sleep may also put you at higher risk for accidents, suicide, and risky behaviors. If you do not get quality sleep, you may also be at higher risk for several health problems, including: Infections. Type 2 diabetes. Heart disease. High blood pressure. Obesity. Worsening of long-term conditions, like arthritis, kidney disease, depression, Parkinson's disease, and epilepsy. What actions can I take to get more quality sleep? Sleep schedule and routine Stick to a sleep schedule. Go to sleep and wake up at about the same time each day. Do not try to sleep less on weekdays and make up for lost sleep on weekends. This does not work. Limit naps during the day to 30 minutes or less. Do not take naps in the late afternoon. Make time to relax before bed. Reading, listening to music, or taking a hot bath  promotes quality sleep. Make your bedroom a place that promotes quality sleep. Keep your bedroom dark, quiet, and at a comfortable room temperature. Make sure your bed is comfortable. Avoid using electronic devices that give off bright blue light for 30 minutes before bedtime. Your brain perceives bright blue light as sunlight. This includes television, phones, and  computers. If you are lying awake in bed for longer than 20 minutes, get up and do a relaxing activity until you feel sleepy. Lifestyle     Try to get at least 30 minutes of exercise on most days. Do not exercise 2-3 hours before going to bed. Do not use any products that contain nicotine  or tobacco. These products include cigarettes, chewing tobacco, and vaping devices, such as e-cigarettes. If you need help quitting, ask your health care provider. Do not drink caffeinated beverages for at least 8 hours before going to bed. Coffee, tea, and some sodas contain caffeine. Do not drink alcohol or eat large meals close to bedtime. Try to get at least 30 minutes of sunlight every day. Morning sunlight is best. Medical concerns Work with your health care provider to treat medical conditions that may affect sleeping, such as: Nasal obstruction. Snoring. Sleep apnea and other sleep disorders. Talk to your health care provider if you think any of your prescription medicines may cause you to have difficulty falling or staying asleep. If you have sleep problems, talk with a sleep consultant. If you think you have a sleep disorder, talk with your health care provider about getting evaluated by a specialist. Where to find more information Sleep Foundation: sleepfoundation.org American Academy of Sleep Medicine: aasm.org Centers for Disease Control and Prevention (CDC): TonerPromos.no Contact a health care provider if: You have trouble getting to sleep or staying asleep. You often wake up very early in the morning and cannot get back to sleep. You have daytime sleepiness. You have daytime sleep attacks of suddenly falling asleep and sudden muscle weakness (narcolepsy). You have a tingling sensation in your legs with a strong urge to move your legs (restless legs syndrome). You stop breathing briefly during sleep (sleep apnea). You think you have a sleep disorder or are taking a medicine that is affecting  your quality of sleep. Summary Most adults need 7-8 hours of quality sleep each night. Getting enough quality sleep is important for your mental and physical health. Make your bedroom a place that promotes quality sleep, and avoid things that may cause you to have poor sleep, such as alcohol, caffeine, smoking, or large meals. Talk to your health care provider if you have trouble falling asleep or staying asleep. This information is not intended to replace advice given to you by your health care provider. Make sure you discuss any questions you have with your health care provider. Document Revised: 02/09/2022 Document Reviewed: 02/09/2022 Elsevier Patient Education  2024 [employer].

## 2024-07-31 ENCOUNTER — Other Ambulatory Visit: Payer: Self-pay | Admitting: Nurse Practitioner

## 2024-07-31 DIAGNOSIS — Z1231 Encounter for screening mammogram for malignant neoplasm of breast: Secondary | ICD-10-CM

## 2024-08-01 ENCOUNTER — Other Ambulatory Visit (HOSPITAL_COMMUNITY): Payer: Self-pay

## 2024-08-01 ENCOUNTER — Inpatient Hospital Stay: Admission: RE | Admit: 2024-08-01 | Discharge: 2024-08-01 | Attending: Nurse Practitioner

## 2024-08-01 DIAGNOSIS — K5900 Constipation, unspecified: Secondary | ICD-10-CM | POA: Diagnosis not present

## 2024-08-01 DIAGNOSIS — K219 Gastro-esophageal reflux disease without esophagitis: Secondary | ICD-10-CM | POA: Diagnosis not present

## 2024-08-01 DIAGNOSIS — R140 Abdominal distension (gaseous): Secondary | ICD-10-CM | POA: Diagnosis not present

## 2024-08-01 DIAGNOSIS — Z1231 Encounter for screening mammogram for malignant neoplasm of breast: Secondary | ICD-10-CM

## 2024-08-01 MED ORDER — FAMOTIDINE 40 MG PO TABS
40.0000 mg | ORAL_TABLET | Freq: Two times a day (BID) | ORAL | 4 refills | Status: AC
  Filled 2024-08-01: qty 180, 90d supply, fill #0
  Filled 2024-11-28: qty 180, 90d supply, fill #1

## 2024-08-01 MED ORDER — PANTOPRAZOLE SODIUM 40 MG PO TBEC
40.0000 mg | DELAYED_RELEASE_TABLET | Freq: Every morning | ORAL | 4 refills | Status: AC
  Filled 2024-08-01: qty 90, 90d supply, fill #0
  Filled 2024-11-28: qty 90, 90d supply, fill #1

## 2024-08-02 ENCOUNTER — Ambulatory Visit

## 2024-08-07 ENCOUNTER — Ambulatory Visit: Payer: Self-pay | Admitting: Nurse Practitioner

## 2024-08-07 ENCOUNTER — Other Ambulatory Visit (HOSPITAL_COMMUNITY): Payer: Self-pay

## 2024-08-12 ENCOUNTER — Other Ambulatory Visit (HOSPITAL_COMMUNITY): Payer: Self-pay

## 2024-08-12 MED ORDER — COVID-19 MRNA VAC-TRIS(PFIZER) 30 MCG/0.3ML IM SUSY
0.3000 mL | PREFILLED_SYRINGE | Freq: Once | INTRAMUSCULAR | 0 refills | Status: AC
  Filled 2024-08-12: qty 0.3, 1d supply, fill #0

## 2024-09-04 ENCOUNTER — Telehealth: Payer: Self-pay | Admitting: Podiatry

## 2024-09-04 NOTE — Telephone Encounter (Signed)
 Patient came in to drop off her orthotics to have them get refurbished. She is dropping off one pair at a time. I am placing the pair in your workstation.

## 2024-09-06 NOTE — Telephone Encounter (Signed)
 1 pr of orthotics sent to [facility] for refurbish  Patient will owe $90 upon pick up   [name] Cped

## 2024-10-02 ENCOUNTER — Telehealth: Payer: Self-pay

## 2024-10-02 NOTE — Telephone Encounter (Signed)
 Refurbished Orthotics are in. Patient is Due $90 at Pick up. Charges are not entered at this time.

## 2024-10-10 NOTE — Telephone Encounter (Signed)
 Called patient to let her know that her refurbished orthotics are in. Unable to connect. LVM

## 2024-12-05 ENCOUNTER — Telehealth: Payer: Self-pay | Admitting: Podiatry

## 2024-12-05 NOTE — Telephone Encounter (Signed)
 Refurbished orthotics are in [place] office. Left a message for [name] to come by to [facility]. No appointment needed.

## 2025-01-23 ENCOUNTER — Encounter: Payer: Self-pay | Admitting: Nurse Practitioner

## 2025-05-28 ENCOUNTER — Ambulatory Visit: Admitting: Neurology
